# Patient Record
Sex: Male | Born: 1970 | Race: White | Hispanic: No | Marital: Married | Smoking: Never smoker
Health system: Southern US, Community
[De-identification: ages and names within clinical notes are randomized; demographics above are authoritative.]

## PROBLEM LIST (undated history)

## (undated) DIAGNOSIS — K219 Gastro-esophageal reflux disease without esophagitis: Secondary | ICD-10-CM

## (undated) DIAGNOSIS — I10 Essential (primary) hypertension: Secondary | ICD-10-CM

## (undated) DIAGNOSIS — H269 Unspecified cataract: Secondary | ICD-10-CM

## (undated) DIAGNOSIS — T7840XA Allergy, unspecified, initial encounter: Secondary | ICD-10-CM

## (undated) DIAGNOSIS — N19 Unspecified kidney failure: Secondary | ICD-10-CM

## (undated) DIAGNOSIS — E785 Hyperlipidemia, unspecified: Secondary | ICD-10-CM

## (undated) HISTORY — DX: Hyperlipidemia, unspecified: E78.5

## (undated) HISTORY — DX: Essential (primary) hypertension: I10

## (undated) HISTORY — DX: Gastro-esophageal reflux disease without esophagitis: K21.9

## (undated) HISTORY — PX: OTHER SURGICAL HISTORY: SHX169

## (undated) HISTORY — DX: Allergy, unspecified, initial encounter: T78.40XA

## (undated) HISTORY — DX: Unspecified kidney failure: N19

## (undated) HISTORY — DX: Unspecified cataract: H26.9

---

## 1995-08-24 HISTORY — PX: OTHER SURGICAL HISTORY: SHX169

## 2000-08-23 DIAGNOSIS — N19 Unspecified kidney failure: Secondary | ICD-10-CM

## 2000-08-23 HISTORY — DX: Unspecified kidney failure: N19

## 2000-08-23 HISTORY — PX: KIDNEY TRANSPLANT: SHX239

## 2000-12-30 ENCOUNTER — Encounter: Payer: Self-pay | Admitting: Vascular Surgery

## 2001-01-03 ENCOUNTER — Encounter: Payer: Self-pay | Admitting: Vascular Surgery

## 2001-01-03 ENCOUNTER — Ambulatory Visit (HOSPITAL_COMMUNITY): Admission: RE | Admit: 2001-01-03 | Discharge: 2001-01-03 | Payer: Self-pay | Admitting: Vascular Surgery

## 2001-07-31 ENCOUNTER — Ambulatory Visit (HOSPITAL_COMMUNITY): Admission: RE | Admit: 2001-07-31 | Discharge: 2001-07-31 | Payer: Self-pay | Admitting: Internal Medicine

## 2002-01-23 ENCOUNTER — Encounter: Payer: Self-pay | Admitting: Nephrology

## 2002-01-23 ENCOUNTER — Encounter: Admission: RE | Admit: 2002-01-23 | Discharge: 2002-01-23 | Payer: Self-pay | Admitting: Nephrology

## 2004-04-22 ENCOUNTER — Encounter: Admission: RE | Admit: 2004-04-22 | Discharge: 2004-04-22 | Payer: Self-pay | Admitting: Nephrology

## 2004-10-29 ENCOUNTER — Encounter: Admission: RE | Admit: 2004-10-29 | Discharge: 2004-10-29 | Payer: Self-pay | Admitting: Nephrology

## 2005-09-26 IMAGING — CR DG FOOT COMPLETE 3+V*R*
3 series · 3 of 3 positions shown · non-contrast
Comparison: none

CLINICAL DATA: Blunt trauma to foot two days ago.  Pain and swelling across midfoot.
 RIGHT FOOT COMPLETE 
 Three views show no acute radiographic abnormality.  There is some soft tissue swelling over the dorsum of the foot. 
 IMPRESSION
 Soft tissue swelling over the dorsum of the foot ? no fracture.

[view not recorded (1 of 3)]
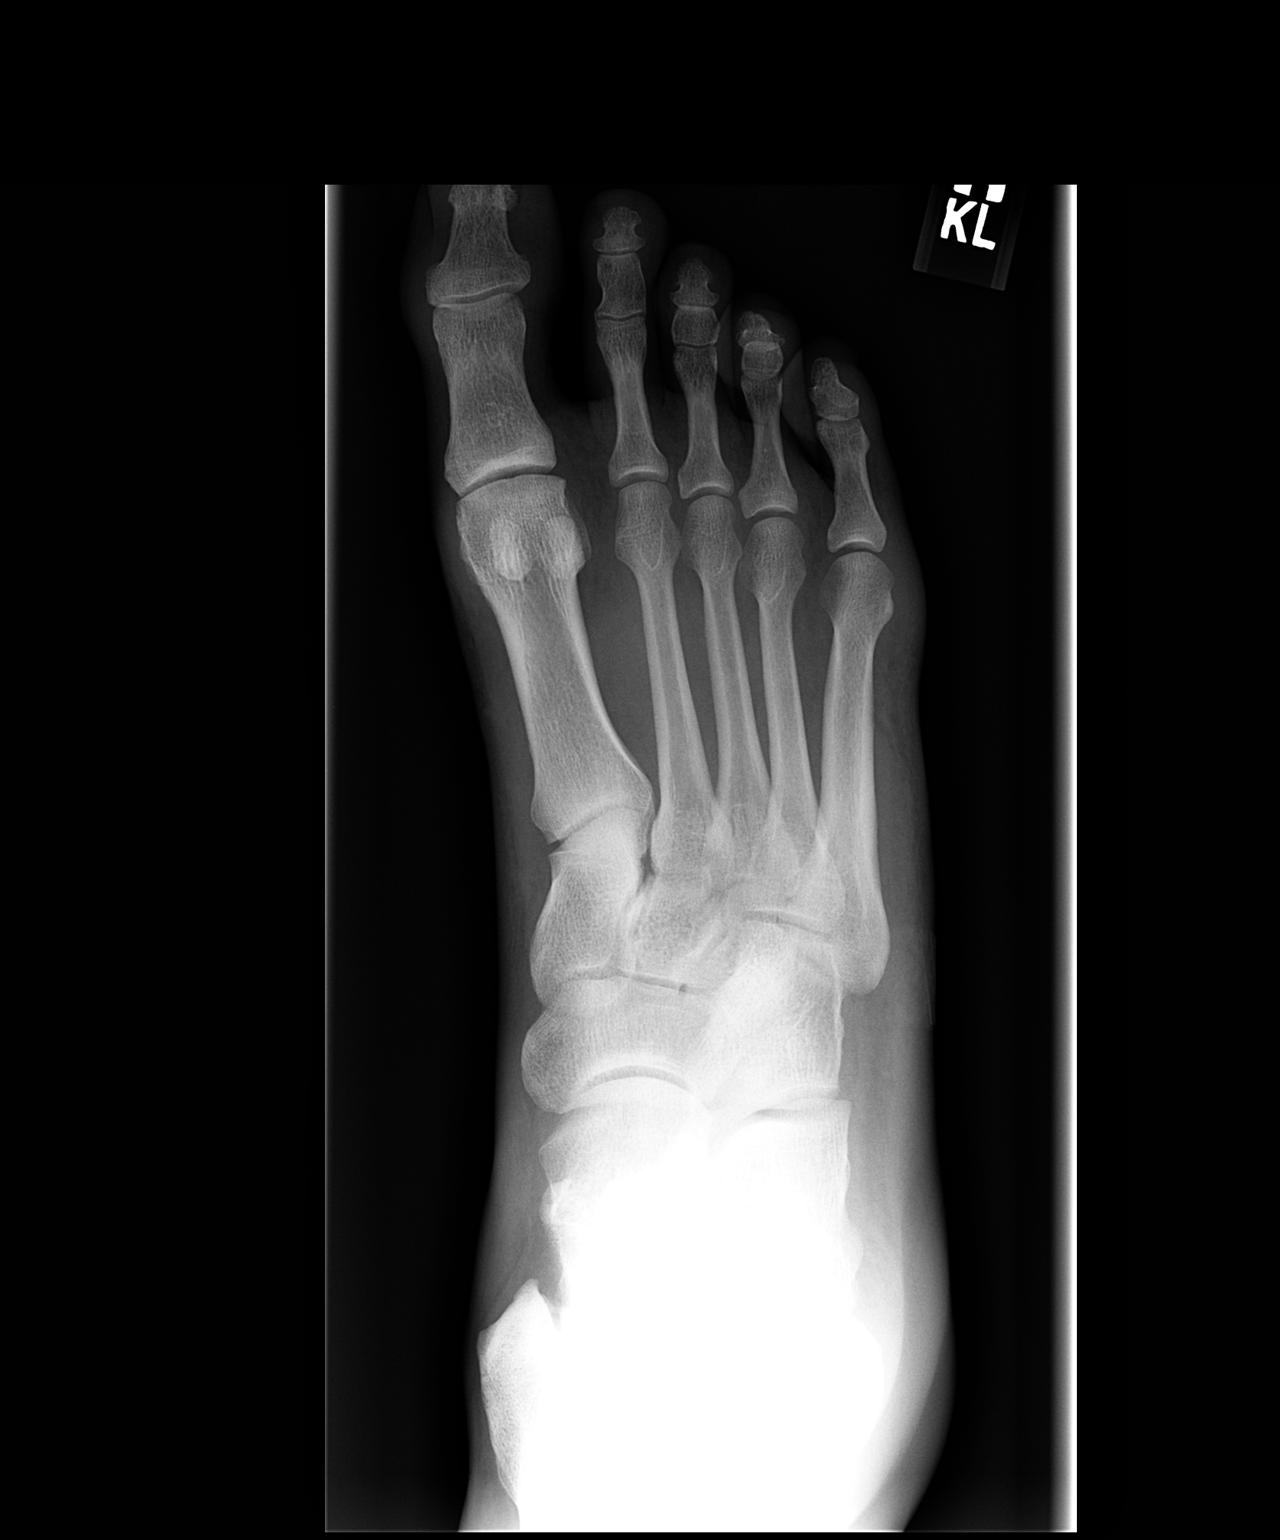

[view not recorded (2 of 3)]
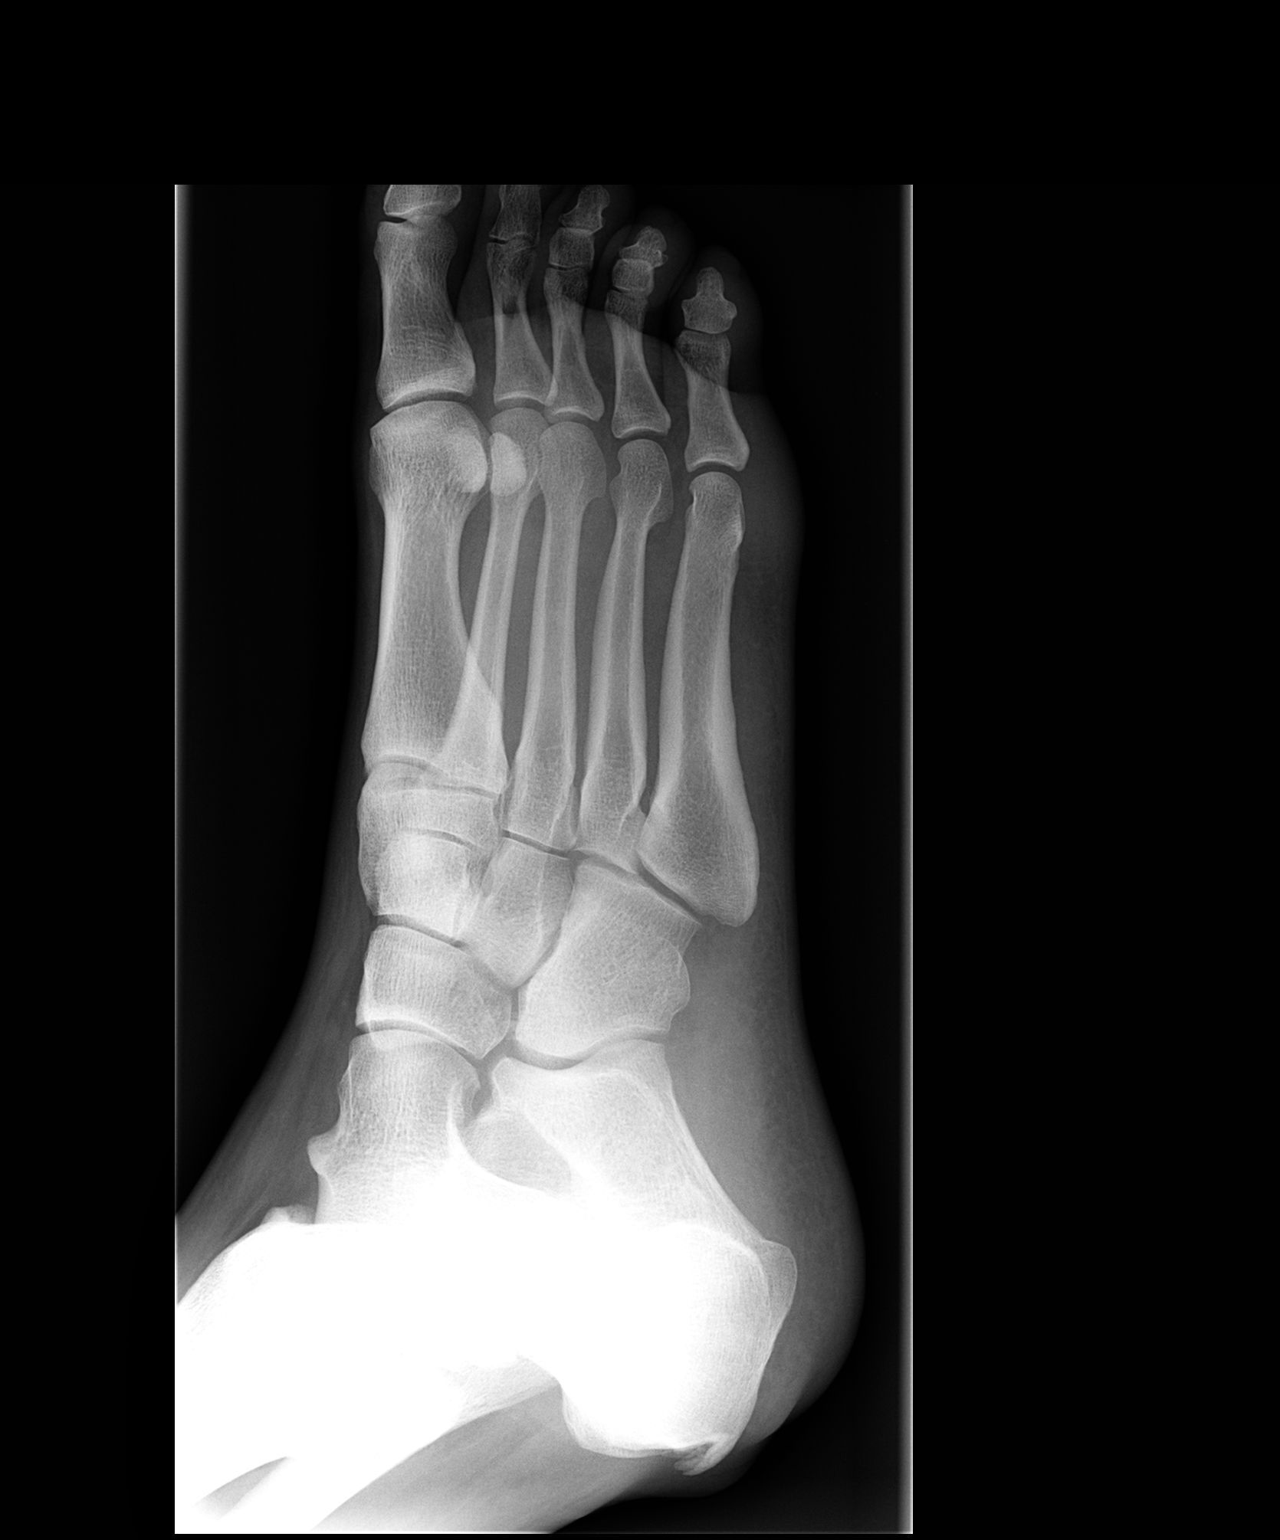

[view not recorded (3 of 3)]
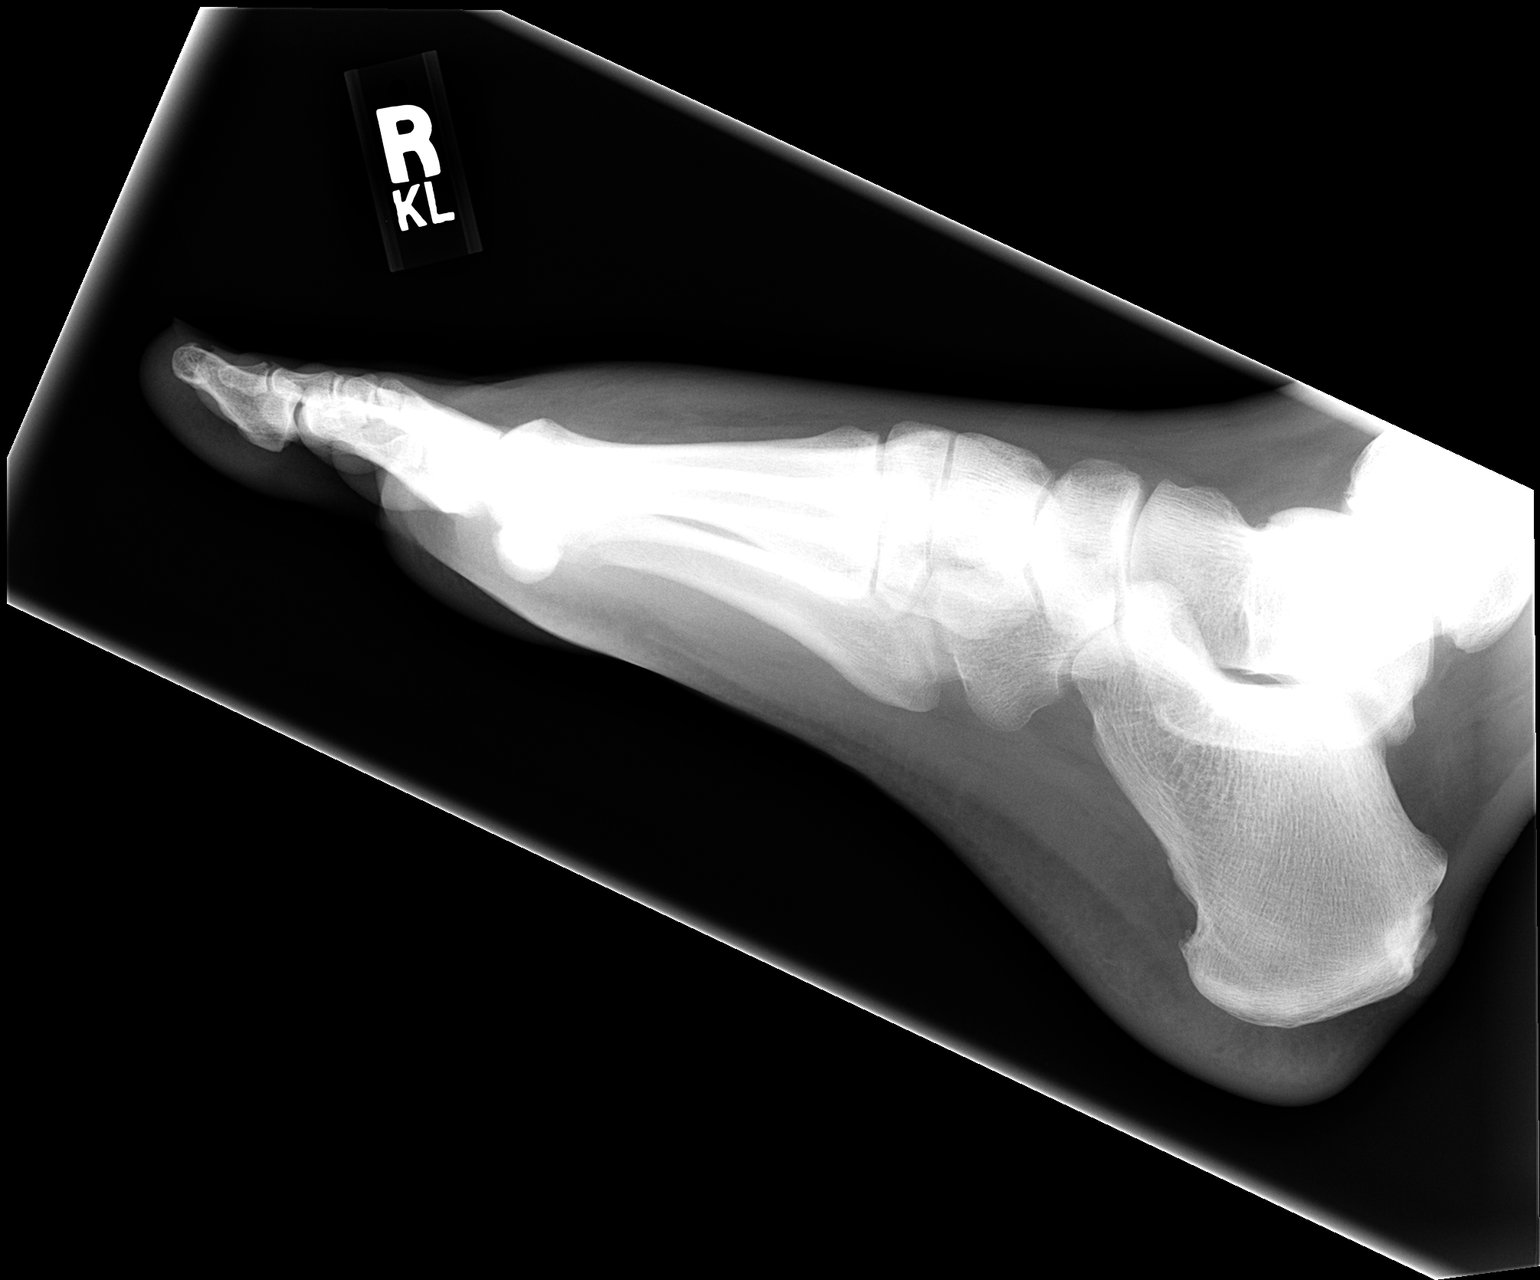

[3 of 3 positions shown; findings below may reference images not displayed]

## 2007-09-06 ENCOUNTER — Encounter: Admission: RE | Admit: 2007-09-06 | Discharge: 2007-09-06 | Payer: Self-pay | Admitting: Nephrology

## 2009-08-23 HISTORY — PX: TONSILLECTOMY: SUR1361

## 2009-09-26 ENCOUNTER — Ambulatory Visit (HOSPITAL_BASED_OUTPATIENT_CLINIC_OR_DEPARTMENT_OTHER): Admission: RE | Admit: 2009-09-26 | Discharge: 2009-09-26 | Payer: Self-pay | Admitting: Urology

## 2010-11-12 LAB — POCT I-STAT, CHEM 8
BUN: 15 mg/dL (ref 6–23)
Calcium, Ion: 1.25 mmol/L (ref 1.12–1.32)
Chloride: 105 mEq/L (ref 96–112)
Creatinine, Ser: 1.3 mg/dL (ref 0.4–1.5)
Glucose, Bld: 92 mg/dL (ref 70–99)
HCT: 47 % (ref 39.0–52.0)
Hemoglobin: 16 g/dL (ref 13.0–17.0)
Potassium: 3.9 mEq/L (ref 3.5–5.1)
Sodium: 142 mEq/L (ref 135–145)
TCO2: 30 mmol/L (ref 0–100)

## 2011-01-08 NOTE — Op Note (Signed)
[place]  Patient:    Michael Hayes, Michael Hayes                      MRN: [id number] Proc. Date: 01/03/01 Adm. Date:  [id number] Disc. Date: [id number] Attending:  Bennye Alm                           Operative Report  PREOPERATIVE DIAGNOSIS:  Chronic renal failure.  POSTOPERATIVE DIAGNOSIS:  Chronic renal failure.  PROCEDURE:  Placement of left internal jugular Ash catheter.  SURGEON:  Di Kindle. Edilia Bo, M.D.  ASSISTANT:  Nurse.  ANESTHESIA:  Local with sedation.  DESCRIPTION OF PROCEDURE:  The patient was taken to the operating room and sedated by anesthesia.  The neck and upper chest were prepped and draped in the usual sterile fashion.  After multiple attempts, I was unable to cannulate the right internal jugular vein, and therefore I decided to place a left IJ catheter.  After the skin was anesthetized, the left internal jugular vein was cannulated and a guidewire introduced into the superior vena cava without difficulty under fluoroscopic control.  The exit site for the catheter was chosen and the skin anesthetized between the two areas.  A 28 cm catheter was passed between the two incisions with the cuff positioned in the midportion of the tunnel.  The tract over the wire was then dilated, and then a dilator and peel-away sheath were passed over the wire and the wire and dilator removed. The catheter was passed through the peel-away sheath and positioned in the right atrium.  Both ports withdrew easily, were then flushed with heparinized saline and filled with concentrated heparin.  The catheter was secured at its exit site with a 3-0 nylon suture.  The IJ cannulation site was closed with a 4-0 subcuticular stitch.  A sterile dressing was applied, and the patient tolerated the procedure well and was transferred to the recovery room in satisfactory condition.  All needle and sponge counts were correct. DD:  01/03/01 TD:   01/03/01 Job: [id number]

## 2011-12-13 ENCOUNTER — Encounter: Payer: Self-pay | Admitting: Internal Medicine

## 2012-01-26 ENCOUNTER — Encounter: Payer: Self-pay | Admitting: Internal Medicine

## 2012-02-17 ENCOUNTER — Ambulatory Visit (AMBULATORY_SURGERY_CENTER): Payer: Managed Care, Other (non HMO) | Admitting: *Deleted

## 2012-02-17 ENCOUNTER — Encounter: Payer: Self-pay | Admitting: Internal Medicine

## 2012-02-17 VITALS — Ht 76.0 in | Wt 238.7 lb

## 2012-02-17 DIAGNOSIS — Z1211 Encounter for screening for malignant neoplasm of colon: Secondary | ICD-10-CM

## 2012-02-17 MED ORDER — MOVIPREP 100 G PO SOLR: ORAL | Status: DC

## 2012-02-17 NOTE — Progress Notes (Signed)
No allergies to eggs or soybeans 

## 2012-03-01 ENCOUNTER — Encounter: Payer: Self-pay | Admitting: *Deleted

## 2012-03-01 NOTE — Telephone Encounter (Signed)
Telephone note opened in error

## 2012-03-02 ENCOUNTER — Ambulatory Visit (AMBULATORY_SURGERY_CENTER): Payer: Managed Care, Other (non HMO) | Admitting: Internal Medicine

## 2012-03-02 ENCOUNTER — Encounter: Payer: Self-pay | Admitting: Internal Medicine

## 2012-03-02 VITALS — BP 115/59 | HR 57 | Temp 98.4°F | Resp 16 | Ht 76.0 in | Wt 238.0 lb

## 2012-03-02 DIAGNOSIS — Z800 Family history of malignant neoplasm of digestive organs: Secondary | ICD-10-CM

## 2012-03-02 DIAGNOSIS — Z1211 Encounter for screening for malignant neoplasm of colon: Secondary | ICD-10-CM

## 2012-03-02 MED ORDER — SODIUM CHLORIDE 0.9 % IV SOLN: 500.0000 mL | INTRAVENOUS | Status: DC

## 2012-03-02 NOTE — Patient Instructions (Addendum)
YOU HAD AN ENDOSCOPIC PROCEDURE TODAY AT THE [facility]: Refer to the procedure report that was given to you for any specific questions about what was found during the examination.  If the procedure report does not answer your questions, please call your gastroenterologist to clarify.  If you requested that your care partner not be given the details of your procedure findings, then the procedure report has been included in a sealed envelope for you to review at your convenience later.  YOU SHOULD EXPECT: Some feelings of bloating in the abdomen. Passage of more gas than usual.  Walking can help get rid of the air that was put into your GI tract during the procedure and reduce the bloating. If you had a lower endoscopy (such as a colonoscopy or flexible sigmoidoscopy) you may notice spotting of blood in your stool or on the toilet paper. If you underwent a bowel prep for your procedure, then you may not have a normal bowel movement for a few days.  DIET: Your first meal following the procedure should be a light meal and then it is ok to progress to your normal diet.  A half-sandwich or bowl of soup is an example of a good first meal.  Heavy or fried foods are harder to digest and may make you feel nauseous or bloated.  Likewise meals heavy in dairy and vegetables can cause extra gas to form and this can also increase the bloating.  Drink plenty of fluids but you should avoid alcoholic beverages for 24 hours.  ACTIVITY: Your care partner should take you home directly after the procedure.  You should plan to take it easy, moving slowly for the rest of the day.  You can resume normal activity the day after the procedure however you should NOT DRIVE or use heavy machinery for 24 hours (because of the sedation medicines used during the test).    SYMPTOMS TO REPORT IMMEDIATELY: A gastroenterologist can be reached at any hour.  During normal business hours, 8:30 AM to 5:00 PM Monday through Friday,  call [phone].  After hours and on weekends, please call the GI answering service at [phone] who will take a message and have the physician on call contact you.   Following lower endoscopy (colonoscopy or flexible sigmoidoscopy):  Excessive amounts of blood in the stool  Significant tenderness or worsening of abdominal pains  Swelling of the abdomen that is new, acute  Fever of 100F or higher      FOLLOW UP: If any biopsies were taken you will be contacted by phone or by letter within the next 1-3 weeks.  Call your gastroenterologist if you have not heard about the biopsies in 3 weeks.  Our staff will call the home number listed on your records the next business day following your procedure to check on you and address any questions or concerns that you may have at that time regarding the information given to you following your procedure. This is a courtesy call and so if there is no answer at the home number and we have not heard from you through the emergency physician on call, we will assume that you have returned to your regular daily activities without incident.  Normal colonscopy.  Follow up in 5 years (2018) due to family history.  SIGNATURES/CONFIDENTIALITY: You and/or your care partner have signed paperwork which will be entered into your electronic medical record.  These signatures attest to the fact that that the information above on  your After Visit Summary has been reviewed and is understood.  Full responsibility of the confidentiality of this discharge information lies with you and/or your care-partner.

## 2012-03-02 NOTE — Op Note (Signed)
[facility] [address] [place], [place]  [address]  COLONOSCOPY PROCEDURE REPORT  PATIENT:  Manav, Pierotti  MR#:  [id number] BIRTHDATE:  11/29/1970, 41 yrs. old  GENDER:  male ENDOSCOPIST:  Wilhemina Bonito. Eda Keys, MD REF. BY:  Screening / Recall PROCEDURE DATE:  03/02/2012 PROCEDURE:  Higher-risk screening colonoscopy G0105 ASA CLASS:  Class II INDICATIONS:  surveillance and high-risk screening, family history of colon cancer ; parent mid 25's (prior exam 2002 negative) MEDICATIONS:   MAC sedation, administered by CRNA, propofol (Diprivan) 350 mg IV  DESCRIPTION OF PROCEDURE:   After the risks benefits and alternatives of the procedure were thoroughly explained, informed consent was obtained.  Digital rectal exam was performed and revealed no abnormalities.   The LB CF-[id number] endoscope was introduced through the anus and advanced to the cecum, which was identified by both the appendix and ileocecal valve, without limitations.  The quality of the prep was excellent, using MoviPrep.  The instrument was then slowly withdrawn as the colon was fully examined. <<PROCEDUREIMAGES>>  FINDINGS:  A normal appearing cecum, ileocecal valve, and appendiceal orifice were identified. The ascending, hepatic flexure, transverse, splenic flexure, descending, sigmoid colon, and rectum appeared unremarkable.  No polyps or cancers were seen. Retroflexed views in the rectum revealed no abnormalities.    The time to cecum =  2:26  minutes. The scope was then withdrawn in 10:30  minutes from the cecum and the procedure completed.  COMPLICATIONS:  None  ENDOSCOPIC IMPRESSION: 1) Normal colon 2) No polyps or cancers  RECOMMENDATIONS: 1) Follow up colonoscopy in 5 years (family history)  ______________________________ Wilhemina Bonito. Eda Keys, MD  CC:  Kari Baars, MD;  The Patient  n. eSIGNED:   Wilhemina Bonito. Eda Keys at 03/02/2012 01:04 PM  Thomasene Lot, [phone]

## 2012-03-02 NOTE — Progress Notes (Signed)
Patient did not experience any of the following events: a burn prior to discharge; a fall within the facility; wrong site/side/patient/procedure/implant event; or a hospital transfer or hospital admission upon discharge from the facility. (G8907) Patient did not have preoperative order for IV antibiotic SSI prophylaxis. (G8918)  

## 2012-03-03 ENCOUNTER — Telehealth: Payer: Self-pay | Admitting: *Deleted

## 2012-03-03 NOTE — Telephone Encounter (Signed)
No answer, left message to call office if questions or concerns. 

## 2016-08-23 HISTORY — PX: COLONOSCOPY: SHX174

## 2016-09-14 DIAGNOSIS — E785 Hyperlipidemia, unspecified: Secondary | ICD-10-CM | POA: Diagnosis not present

## 2016-09-14 DIAGNOSIS — Z940 Kidney transplant status: Secondary | ICD-10-CM | POA: Diagnosis not present

## 2016-09-20 DIAGNOSIS — Z940 Kidney transplant status: Secondary | ICD-10-CM | POA: Diagnosis not present

## 2016-09-20 DIAGNOSIS — I129 Hypertensive chronic kidney disease with stage 1 through stage 4 chronic kidney disease, or unspecified chronic kidney disease: Secondary | ICD-10-CM | POA: Diagnosis not present

## 2016-09-20 DIAGNOSIS — E785 Hyperlipidemia, unspecified: Secondary | ICD-10-CM | POA: Diagnosis not present

## 2016-09-20 DIAGNOSIS — N183 Chronic kidney disease, stage 3 (moderate): Secondary | ICD-10-CM | POA: Diagnosis not present

## 2016-09-22 DIAGNOSIS — Z940 Kidney transplant status: Secondary | ICD-10-CM | POA: Diagnosis not present

## 2016-10-06 DIAGNOSIS — M5416 Radiculopathy, lumbar region: Secondary | ICD-10-CM | POA: Diagnosis not present

## 2016-10-06 DIAGNOSIS — Z6828 Body mass index (BMI) 28.0-28.9, adult: Secondary | ICD-10-CM | POA: Diagnosis not present

## 2016-10-25 ENCOUNTER — Encounter: Payer: Self-pay | Admitting: Physical Therapy

## 2016-10-25 ENCOUNTER — Ambulatory Visit: Payer: BLUE CROSS/BLUE SHIELD | Attending: Internal Medicine | Admitting: Physical Therapy

## 2016-10-25 DIAGNOSIS — M62830 Muscle spasm of back: Secondary | ICD-10-CM

## 2016-10-25 DIAGNOSIS — M6281 Muscle weakness (generalized): Secondary | ICD-10-CM | POA: Diagnosis not present

## 2016-10-25 DIAGNOSIS — G8929 Other chronic pain: Secondary | ICD-10-CM

## 2016-10-25 DIAGNOSIS — M5442 Lumbago with sciatica, left side: Secondary | ICD-10-CM | POA: Diagnosis not present

## 2016-10-26 ENCOUNTER — Encounter: Payer: Self-pay | Admitting: Physical Therapy

## 2016-10-26 NOTE — Therapy (Signed)
[place], [place], [address] Phone: [phone]   Fax:  [phone]  Physical Therapy Evaluation  Patient Details  Name: Michael Hayes MRN: [id number] Date of Birth: 18-Feb-1971 Referring Provider: Dr Marton Redwood   Encounter Date: 10/25/2016      PT End of Session - 10/26/16 1312    Visit Number 1   Number of Visits 12   Date for PT Re-Evaluation 12/07/16   Authorization Type Blue cross/ blue shield    PT Start Time 0800   PT Stop Time 0844   PT Time Calculation (min) 44 min   Activity Tolerance Patient tolerated treatment well   Behavior During Therapy [facility] for tasks assessed/performed      Past Medical History:  Diagnosis Date  . Hyperlipidemia   . Hypertension   . Kidney failure 2002   kidney transplant 2002    Past Surgical History:  Procedure Laterality Date  . bone spur  1997   heel, left foot  . KIDNEY TRANSPLANT  2002  . TONSILLECTOMY  2011    There were no vitals filed for this visit.       Subjective Assessment - 10/25/16 0803    Subjective Patient was lifting furniture when he began to have pain on the left side of his back. The pain goes down into his left buttock and left hamstring. He feels the pain when he is going form sit to stand. the pain is at its worst when he is sitting down into the car.    Pertinent History kidney transplant on the right side   Limitations Sitting;House hold activities   How long can you sit comfortably? No limit   How long can you stand comfortably? No limit   How long can you walk comfortably? No limit    Diagnostic tests No imaging tested    Currently in Pain? Yes   Pain Score 4   Pain only when standing up    Pain Location Back   Pain Orientation Left   Pain Descriptors / Indicators Aching   Pain Type Chronic pain   Pain Onset More than a month ago   Pain Frequency Intermittent   Aggravating Factors  transfering sit to stand              [facility] PT Assessment - 10/26/16 0001      Assessment   Medical Diagnosis Lef tisded lower back pain and Sciatica    Referring Provider Dr Marton Redwood    Onset Date/Surgical Date --  December 2018   Hand Dominance Right   Next MD Visit MD visit if needed    Prior Therapy None      Precautions   Precautions None     Restrictions   Weight Bearing Restrictions No     Balance Screen   Has the patient fallen in the past 6 months No   Has the patient had a decrease in activity level because of a fear of falling?  No   Is the patient reluctant to leave their home because of a fear of falling?  No     Home Environment   Living Environment Private residence   Additional Comments has steps but has no pain with steps      Prior Function   Level of Independence Independent   Vocation Full time employment   Corporate treasurer: Sits at a desk    Leisure Getting back to the gym  Cognition   Overall Cognitive Status Within Functional Limits for tasks assessed   Attention Focused   Focused Attention Appears intact   Memory Appears intact   Awareness Appears intact   Problem Solving Appears intact     Observation/Other Assessments   Observations Sits with flexed posture    Focus on Therapeutic Outcomes (FOTO)  33% limited      Sensation   Additional Comments Pain radiating into the left buttcck      Coordination   Gross Motor Movements are Fluid and Coordinated Yes   Fine Motor Movements are Fluid and Coordinated Yes     Posture/Postural Control   Posture/Postural Control No significant limitations     AROM   Overall AROM Comments left side glide reproduction of pain.    Lumbar Flexion limited 50% with pain    Lumbar Extension Linmited ho% with pain    Lumbar - Right Side Bend no limit    Lumbar - Left Side Bend painon the left side    Lumbar - Right Rotation No limi   Lumbar - Left Rotation pulling feeling on the right      Strength   Right  Hip Extension 5/5   Right Hip ABduction 5/5   Right Hip ADduction 5/5   Left Hip Flexion 4+/5   Left Hip ABduction 4+/5   Left Hip ADduction 4+/5   Right/Left Knee Right;Left   Right Knee Flexion 5/5   Right Knee Extension 5/5   Left Knee Flexion 5/5   Left Knee Extension 5/5     Palpation   SI assessment  No significant limitations in SI movement    Palpation comment minor spasming around L3 -L4 on the left; Normal pelvic allignment     Special Tests    Special Tests Lumbar   Lumbar Tests Straight Leg Raise     Straight Leg Raise   Comment (-) on the left      Transfers   Comments Pain when transfering sit to stand and stand to sit      Ambulation/Gait   Gait Comments No abnormalities                    [facility] Adult PT Treatment/Exercise - 10/26/16 0001      Lumbar Exercises: Stretches   Passive Hamstring Stretch Limitations 2x20sec 90/90 bilateral    Single Knee to Chest Stretch Limitations 2x20 seconds bilateral      Lumbar Exercises: Supine   Clam Limitations supine clam shells red 2x10    Heel Slides Limitations reviewed for progression    Bent Knee Raise Limitations 2x10      Lumbar Exercises: Prone   Other Prone Lumbar Exercises prone on elbow 30sec ; prone press press ups 2x10                 PT Education - 10/26/16 1311    Education provided Yes   Education Details reviewed HEP, symptom mangement, educated on lumbar disc dysfunction    Person(s) Educated Patient   Methods Explanation;Demonstration   Comprehension Verbalized understanding;Returned demonstration;Need further instruction          PT Short Term Goals - 10/26/16 1319      PT SHORT TERM GOAL #1   Title Patient will increase bilateral hamstring length by 25% bilateral    Time 4   Period Weeks   Status New     PT SHORT TERM GOAL #2   Title Patient will increase lumbar flexion  by 25%    Time 4   Period Weeks   Status New     PT SHORT TERM GOAL #3   Title  Patient will increase bilateral LE strength to 5/5    Time 4   Period Weeks   Status New     PT SHORT TERM GOAL #4   Title Patient will be independent with initial HEP    Time 4   Period Weeks   Status New           PT Long Term Goals - 10/26/16 1321      PT LONG TERM GOAL #1   Title Patient will return to gym program that promotes spinal stability and flexability    Time 6   Period Weeks   Status New     PT LONG TERM GOAL #2   Title Patient will transfer sit to stand and stand to sit without pain    Time 6   Period Weeks   Status New     PT LONG TERM GOAL #3   Title Patient willshow a 20% limitation on FOTO    Time 6   Period Weeks   Status New               Plan - 10/26/16 1313    Clinical Impression Statement Patient is a 47 year old male with left sided lower back pain that increase when he sits. Signs and symptoms are consistent with a lumbar disc buldge. He has increased pain with flexion and when moving to a sitting position. He has tight hamstrings L > R.  He would benefit from skilled therapy to improve low back pain and to improve his ability to sit pain free.    Rehab Potential Good   PT Frequency 2x / week   PT Duration 8 weeks   PT Treatment/Interventions ADLs/Self Care Home Management;Cryotherapy;Electrical Stimulation;Gait training;Ultrasound;Moist Heat;Traction;Therapeutic activities;Therapeutic exercise;Neuromuscular re-education;Patient/family education;Passive range of motion;Manual techniques;Dry needling;Energy conservation;Taping;Vasopneumatic Device   PT Next Visit Plan assess reaction to exercises, Consider adding bridging, sidelying hip abduction, straight leg raise, quadruped alternating UE/ LE; review lifting technique and squats, consider manual therapy if needed.    PT Home Exercise Plan hamstring stretch, single knee to chest, POE, prone press-ups    Recommended Other Services None    Consulted and Agree with Plan of Care Patient       Patient will benefit from skilled therapeutic intervention in order to improve the following deficits and impairments:  Decreased range of motion, Difficulty walking, Decreased endurance, Decreased strength, Decreased mobility, Pain, Increased muscle spasms  Visit Diagnosis: Chronic left-sided low back pain with left-sided sciatica - Plan: PT plan of care cert/re-cert  Muscle weakness (generalized) - Plan: PT plan of care cert/re-cert  Muscle spasm of back - Plan: PT plan of care cert/re-cert     Problem List There are no active problems to display for this patient.   Carney Living PT DPT  10/26/2016, 1:29 PM  [facility] [address] [place], [place], [address] Phone: [phone]   Fax:  [phone]  Name: Michael Hayes MRN: [id number] Date of Birth: 04/11/1971

## 2016-11-03 ENCOUNTER — Encounter: Payer: Self-pay | Admitting: Physical Therapy

## 2016-11-03 ENCOUNTER — Ambulatory Visit: Payer: BLUE CROSS/BLUE SHIELD | Admitting: Physical Therapy

## 2016-11-03 DIAGNOSIS — M6281 Muscle weakness (generalized): Secondary | ICD-10-CM | POA: Diagnosis not present

## 2016-11-03 DIAGNOSIS — M62830 Muscle spasm of back: Secondary | ICD-10-CM

## 2016-11-03 DIAGNOSIS — G8929 Other chronic pain: Secondary | ICD-10-CM

## 2016-11-03 DIAGNOSIS — M5442 Lumbago with sciatica, left side: Principal | ICD-10-CM

## 2016-11-03 NOTE — Therapy (Addendum)
[place], [place], [address] Phone: [phone]   Fax:  [phone]  Physical Therapy treatment/ discharge   Patient Details  Name: Michael Hayes MRN: [id number] Date of Birth: [birth year] Referring Provider: Dr Marton Redwood   Encounter Date: 11/03/2016      PT End of Session - 11/03/16 0806    Visit Number 2   Number of Visits 12   Date for PT Re-Evaluation 12/07/16   Authorization Type Blue cross/ blue shield    PT Start Time 0800   PT Stop Time 0844   PT Time Calculation (min) 44 min   Activity Tolerance Patient tolerated treatment well   Behavior During Therapy [facility] for tasks assessed/performed      Past Medical History:  Diagnosis Date  . Hyperlipidemia   . Hypertension   . Kidney failure 2002   kidney transplant 2002    Past Surgical History:  Procedure Laterality Date  . bone spur  1997   heel, left foot  . KIDNEY TRANSPLANT  2002  . TONSILLECTOMY  2011    There were no vitals filed for this visit.       Subjective Assessment - 11/03/16 0804    Subjective Patient reports his pain is more centralized and less intense. He has been doing his stretches and exercises.    Pertinent History kidney transplant on the right side   Limitations Sitting;House hold activities   How long can you sit comfortably? No limit   How long can you stand comfortably? No limit   How long can you walk comfortably? No limit    Diagnostic tests No imaging tested    Currently in Pain? Yes   Pain Score 2    Pain Location Back   Pain Orientation Left   Pain Descriptors / Indicators Aching   Pain Onset More than a month ago   Pain Frequency Intermittent   Aggravating Factors  transfering sit to stand    Pain Relieving Factors rest    Effect of Pain on Daily Activities pain when sitting                        [facility] Adult PT Treatment/Exercise - 11/03/16 0001      Lumbar Exercises:  Stretches   Passive Hamstring Stretch Limitations 2x20sec 90/90 bilateral    Single Knee to Chest Stretch Limitations 2x20 seconds bilateral    Piriformis Stretch Limitations 3x20sec hold      Lumbar Exercises: Supine   Clam Limitations supine clam shells red 2x10    Bridge Limitations 2x10    Straight Leg Raises Limitations 2x10     Lumbar Exercises: Prone   Other Prone Lumbar Exercises prone on elbow 30sec ; prone press press ups 2x10      Lumbar Exercises: Quadruped   Other Quadruped Lumbar Exercises prayer/ lateral prayer 3x20sec each    Other Quadruped Lumbar Exercises alt UE/ LE 2x5      Manual Therapy   Manual therapy comments IASTYM to lumbar spine L4-L5 PA glides.                 PT Education - 11/03/16 0805    Education provided Yes   Education Details updated HEP    Person(s) Educated Patient   Methods Explanation;Demonstration   Comprehension Verbalized understanding;Returned demonstration          PT Short Term Goals - 11/03/16 1307  PT SHORT TERM GOAL #1   Title Patient will increase bilateral hamstring length by 25% bilateral    Time 4   Period Weeks   Status On-going     PT SHORT TERM GOAL #2   Title Patient will increase lumbar flexion by 25%    Time 4   Period Weeks   Status On-going     PT SHORT TERM GOAL #3   Title Patient will increase bilateral LE strength to 5/5    Time 4   Period Weeks   Status On-going     PT SHORT TERM GOAL #4   Title Patient will be independent with initial HEP    Time 4   Period Weeks   Status On-going           PT Long Term Goals - 10/26/16 1321      PT LONG TERM GOAL #1   Title Patient will return to gym program that promotes spinal stability and flexability    Time 6   Period Weeks   Status New     PT LONG TERM GOAL #2   Title Patient will transfer sit to stand and stand to sit without pain    Time 6   Period Weeks   Status New     PT LONG TERM GOAL #3   Title Patient willshow a  20% limitation on FOTO    Time 6   Period Weeks   Status New               Plan - 11/03/16 0807    Clinical Impression Statement Patient is making good progress. He tolerated exercises well. No new goals met. Updated HEP and added increased intensity ther-ex. He feels like he is making ggood progress. He will try exercises at home and schedule if nreeded    Rehab Potential Good   PT Frequency 2x / week   PT Duration 8 weeks   PT Treatment/Interventions ADLs/Self Care Home Management;Cryotherapy;Electrical Stimulation;Gait training;Ultrasound;Moist Heat;Traction;Therapeutic activities;Therapeutic exercise;Neuromuscular re-education;Patient/family education;Passive range of motion;Manual techniques;Dry needling;Energy conservation;Taping;Vasopneumatic Device   PT Next Visit Plan assess reaction to exercises, Consider adding bridging, sidelying hip abduction, straight leg raise, quadruped alternating UE/ LE; review lifting technique and squats, consider manual therapy if needed.    PT Home Exercise Plan hamstring stretch, single knee to chest, POE, prone press-ups    Consulted and Agree with Plan of Care Patient      Patient will benefit from skilled therapeutic intervention in order to improve the following deficits and impairments:  Decreased range of motion, Difficulty walking, Decreased endurance, Decreased strength, Decreased mobility, Pain, Increased muscle spasms  Visit Diagnosis: Chronic left-sided low back pain with left-sided sciatica  Muscle weakness (generalized)  Muscle spasm of back   PHYSICAL THERAPY DISCHARGE SUMMARY  Visits from Start of Care: 2  Current functional level related to goals / functional outcomes: Improved symptoms. Patient feels he can continue on his on at home.    Remaining deficits: Intermittent pain    Education / Equipment: HEP Plan: Patient agrees to discharge.  Patient goals were not met. Patient is being discharged due to not  returning since the last visit.  ?????     Problem List There are no active problems to display for this patient.   Carney Living PT DPT  11/03/2016, 1:21 PM  [facility] [address] [place], [place], [address] Phone: [phone]   Fax:  [phone]  Name: Michael Wimberly  Hayes MRN: [id number] Date of Birth: [birth year]

## 2016-12-02 DIAGNOSIS — N183 Chronic kidney disease, stage 3 (moderate): Secondary | ICD-10-CM | POA: Diagnosis not present

## 2016-12-02 DIAGNOSIS — Z940 Kidney transplant status: Secondary | ICD-10-CM | POA: Diagnosis not present

## 2016-12-30 ENCOUNTER — Encounter: Payer: Self-pay | Admitting: Internal Medicine

## 2017-01-10 DIAGNOSIS — I10 Essential (primary) hypertension: Secondary | ICD-10-CM | POA: Diagnosis not present

## 2017-01-10 DIAGNOSIS — Z0000 Encounter for general adult medical examination without abnormal findings: Secondary | ICD-10-CM | POA: Diagnosis not present

## 2017-01-10 DIAGNOSIS — Z125 Encounter for screening for malignant neoplasm of prostate: Secondary | ICD-10-CM | POA: Diagnosis not present

## 2017-01-18 DIAGNOSIS — D849 Immunodeficiency, unspecified: Secondary | ICD-10-CM | POA: Diagnosis not present

## 2017-01-18 DIAGNOSIS — I10 Essential (primary) hypertension: Secondary | ICD-10-CM | POA: Diagnosis not present

## 2017-01-18 DIAGNOSIS — Z1389 Encounter for screening for other disorder: Secondary | ICD-10-CM | POA: Diagnosis not present

## 2017-01-18 DIAGNOSIS — E784 Other hyperlipidemia: Secondary | ICD-10-CM | POA: Diagnosis not present

## 2017-01-18 DIAGNOSIS — Z0000 Encounter for general adult medical examination without abnormal findings: Secondary | ICD-10-CM | POA: Diagnosis not present

## 2017-01-18 DIAGNOSIS — Z940 Kidney transplant status: Secondary | ICD-10-CM | POA: Diagnosis not present

## 2017-01-18 DIAGNOSIS — Z125 Encounter for screening for malignant neoplasm of prostate: Secondary | ICD-10-CM | POA: Diagnosis not present

## 2017-03-18 DIAGNOSIS — I129 Hypertensive chronic kidney disease with stage 1 through stage 4 chronic kidney disease, or unspecified chronic kidney disease: Secondary | ICD-10-CM | POA: Diagnosis not present

## 2017-03-18 DIAGNOSIS — E785 Hyperlipidemia, unspecified: Secondary | ICD-10-CM | POA: Diagnosis not present

## 2017-03-18 DIAGNOSIS — N183 Chronic kidney disease, stage 3 (moderate): Secondary | ICD-10-CM | POA: Diagnosis not present

## 2017-03-18 DIAGNOSIS — Z940 Kidney transplant status: Secondary | ICD-10-CM | POA: Diagnosis not present

## 2017-03-21 DIAGNOSIS — Z940 Kidney transplant status: Secondary | ICD-10-CM | POA: Diagnosis not present

## 2017-04-29 ENCOUNTER — Encounter: Payer: Self-pay | Admitting: Internal Medicine

## 2017-05-16 ENCOUNTER — Other Ambulatory Visit: Payer: Self-pay | Admitting: Internal Medicine

## 2017-05-16 DIAGNOSIS — R221 Localized swelling, mass and lump, neck: Secondary | ICD-10-CM | POA: Diagnosis not present

## 2017-05-19 ENCOUNTER — Ambulatory Visit
Admission: RE | Admit: 2017-05-19 | Discharge: 2017-05-19 | Disposition: A | Discharge status: Home / Self Care | Payer: BLUE CROSS/BLUE SHIELD | Source: Ambulatory Visit | Attending: Internal Medicine | Admitting: Internal Medicine

## 2017-05-19 DIAGNOSIS — R221 Localized swelling, mass and lump, neck: Secondary | ICD-10-CM | POA: Diagnosis not present

## 2017-06-21 DIAGNOSIS — Z940 Kidney transplant status: Secondary | ICD-10-CM | POA: Diagnosis not present

## 2017-06-21 DIAGNOSIS — E785 Hyperlipidemia, unspecified: Secondary | ICD-10-CM | POA: Diagnosis not present

## 2017-07-01 ENCOUNTER — Ambulatory Visit (AMBULATORY_SURGERY_CENTER): Payer: Self-pay

## 2017-07-01 ENCOUNTER — Other Ambulatory Visit: Payer: Self-pay

## 2017-07-01 VITALS — Ht 76.0 in | Wt 248.0 lb

## 2017-07-01 DIAGNOSIS — Z800 Family history of malignant neoplasm of digestive organs: Secondary | ICD-10-CM

## 2017-07-01 MED ORDER — NA SULFATE-K SULFATE-MG SULF 17.5-3.13-1.6 GM/177ML PO SOLN: 1.0000 | Freq: Once | ORAL | 0 refills | Status: AC

## 2017-07-01 NOTE — Progress Notes (Signed)
Denies allergies to eggs or soy products. Denies complication of anesthesia or sedation. Denies use of weight loss medication. Denies use of O2.   Emmi instructions declined.  

## 2017-07-08 ENCOUNTER — Encounter: Payer: Self-pay | Admitting: Internal Medicine

## 2017-07-12 ENCOUNTER — Ambulatory Visit (AMBULATORY_SURGERY_CENTER): Payer: BLUE CROSS/BLUE SHIELD | Admitting: Internal Medicine

## 2017-07-12 ENCOUNTER — Encounter: Payer: Self-pay | Admitting: Internal Medicine

## 2017-07-12 VITALS — BP 117/78 | HR 65 | Temp 98.6°F | Resp 16 | Ht 76.0 in | Wt 248.0 lb

## 2017-07-12 DIAGNOSIS — Z1212 Encounter for screening for malignant neoplasm of rectum: Secondary | ICD-10-CM

## 2017-07-12 DIAGNOSIS — Z800 Family history of malignant neoplasm of digestive organs: Secondary | ICD-10-CM | POA: Diagnosis present

## 2017-07-12 DIAGNOSIS — Z1211 Encounter for screening for malignant neoplasm of colon: Secondary | ICD-10-CM | POA: Diagnosis not present

## 2017-07-12 MED ORDER — SODIUM CHLORIDE 0.9 % IV SOLN: 500.0000 mL | INTRAVENOUS | Status: DC

## 2017-07-12 NOTE — Patient Instructions (Signed)
Impression/recommendations:  Hemorrhoids (handout given)  YOU HAD AN ENDOSCOPIC PROCEDURE TODAY AT [place]:   Refer to the procedure report that was given to you for any specific questions about what was found during the examination.  If the procedure report does not answer your questions, please call your gastroenterologist to clarify.  If you requested that your care partner not be given the details of your procedure findings, then the procedure report has been included in a sealed envelope for you to review at your convenience later.  YOU SHOULD EXPECT: Some feelings of bloating in the abdomen. Passage of more gas than usual.  Walking can help get rid of the air that was put into your GI tract during the procedure and reduce the bloating. If you had a lower endoscopy (such as a colonoscopy or flexible sigmoidoscopy) you may notice spotting of blood in your stool or on the toilet paper. If you underwent a bowel prep for your procedure, you may not have a normal bowel movement for a few days.  Please Note:  You might notice some irritation and congestion in your nose or some drainage.  This is from the oxygen used during your procedure.  There is no need for concern and it should clear up in a day or so.  SYMPTOMS TO REPORT IMMEDIATELY:   Following lower endoscopy (colonoscopy or flexible sigmoidoscopy):  Excessive amounts of blood in the stool  Significant tenderness or worsening of abdominal pains  Swelling of the abdomen that is new, acute  Fever of 100F or higher  For urgent or emergent issues, a gastroenterologist can be reached at any hour by calling [phone].   DIET:  We do recommend a small meal at first, but then you may proceed to your regular diet.  Drink plenty of fluids but you should avoid alcoholic beverages for 24 hours.  ACTIVITY:  You should plan to take it easy for the rest of today and you should NOT DRIVE or use heavy machinery until tomorrow  (because of the sedation medicines used during the test).    FOLLOW UP: Our staff will call the number listed on your records the next business day following your procedure to check on you and address any questions or concerns that you may have regarding the information given to you following your procedure. If we do not reach you, we will leave a message.  However, if you are feeling well and you are not experiencing any problems, there is no need to return our call.  We will assume that you have returned to your regular daily activities without incident.  If any biopsies were taken you will be contacted by phone or by letter within the next 1-3 weeks.  Please call us at [phone] if you have not heard about the biopsies in 3 weeks.    SIGNATURES/CONFIDENTIALITY: You and/or your care partner have signed paperwork which will be entered into your electronic medical record.  These signatures attest to the fact that that the information above on your After Visit Summary has been reviewed and is understood.  Full responsibility of the confidentiality of this discharge information lies with you and/or your care-partner.

## 2017-07-12 NOTE — Op Note (Signed)
[place] Patient Name: Michael Hayes Procedure Date: 07/12/2017 10:37 AM MRN: [id number] Endoscopist: Docia Chuck. Henrene Pastor , MD Age: 46 Referring MD:  Date of Birth: 03/05/1971 Gender: Male Account #: [id number] Procedure:                Colonoscopy Indications:              Screening in patient at increased risk: Colorectal                            cancer in father before age 73. Prior negative                            examinations 2002 and 2013 Medicines:                Monitored Anesthesia Care Procedure:                Pre-Anesthesia Assessment:                           - Prior to the procedure, a History and Physical                            was performed, and patient medications and                            allergies were reviewed. The patient's tolerance of                            previous anesthesia was also reviewed. The risks                            and benefits of the procedure and the sedation                            options and risks were discussed with the patient.                            All questions were answered, and informed consent                            was obtained. Prior Anticoagulants: The patient has                            taken no previous anticoagulant or antiplatelet                            agents. ASA Grade Assessment: II - A patient with                            mild systemic disease. After reviewing the risks                            and benefits, the patient was deemed in  satisfactory condition to undergo the procedure.                           After obtaining informed consent, the colonoscope                            was passed under direct vision. Throughout the                            procedure, the patient's blood pressure, pulse, and                            oxygen saturations were monitored continuously. The                            Colonoscope was introduced through  the anus and                            advanced to the the cecum, identified by                            appendiceal orifice and ileocecal valve. The                            ileocecal valve, appendiceal orifice, and rectum                            were photographed. The quality of the bowel                            preparation was excellent. The colonoscopy was                            performed without difficulty. The patient tolerated                            the procedure well. The bowel preparation used was                            SUPREP. Scope In: 10:48:58 AM Scope Out: 11:00:44 AM Scope Withdrawal Time: 0 hours 10 minutes 7 seconds  Total Procedure Duration: 0 hours 11 minutes 46 seconds  Findings:                 Internal hemorrhoids were found during retroflexion.                           The exam was otherwise without abnormality on                            direct and retroflexion views. Complications:            No immediate complications. Estimated blood loss:                            None. Estimated Blood  Loss:     Estimated blood loss: none. Impression:               - Internal hemorrhoids.                           - The examination was otherwise normal on direct                            and retroflexion views.                           - No specimens collected. Recommendation:           - Repeat colonoscopy in 5 years for screening                            purposes (family history).                           - Patient has a contact number available for                            emergencies. The signs and symptoms of potential                            delayed complications were discussed with the                            patient. Return to normal activities tomorrow.                            Written discharge instructions were provided to the                            patient.                           - Resume previous diet.                            - Continue present medications. Docia Chuck. Henrene Pastor, MD 07/12/2017 11:08:08 AM This report has been signed electronically.

## 2017-07-12 NOTE — Progress Notes (Signed)
To PACU, VSS. Report to [email or web address] 

## 2017-07-13 ENCOUNTER — Telehealth: Payer: Self-pay | Admitting: *Deleted

## 2017-07-13 ENCOUNTER — Telehealth: Payer: Self-pay

## 2017-07-13 NOTE — Telephone Encounter (Signed)
Left message on answering machine. 

## 2017-07-13 NOTE — Telephone Encounter (Signed)
  Follow up Call-  Call back number 07/12/2017  Post procedure Call Back phone  # [phone]  Permission to leave phone message Yes  Some recent data might be hidden    [facility]

## 2017-09-07 DIAGNOSIS — Z940 Kidney transplant status: Secondary | ICD-10-CM | POA: Diagnosis not present

## 2017-09-13 DIAGNOSIS — E785 Hyperlipidemia, unspecified: Secondary | ICD-10-CM | POA: Diagnosis not present

## 2017-09-13 DIAGNOSIS — N183 Chronic kidney disease, stage 3 (moderate): Secondary | ICD-10-CM | POA: Diagnosis not present

## 2017-09-13 DIAGNOSIS — I129 Hypertensive chronic kidney disease with stage 1 through stage 4 chronic kidney disease, or unspecified chronic kidney disease: Secondary | ICD-10-CM | POA: Diagnosis not present

## 2017-09-13 DIAGNOSIS — Z940 Kidney transplant status: Secondary | ICD-10-CM | POA: Diagnosis not present

## 2017-12-07 DIAGNOSIS — E785 Hyperlipidemia, unspecified: Secondary | ICD-10-CM | POA: Diagnosis not present

## 2017-12-07 DIAGNOSIS — I129 Hypertensive chronic kidney disease with stage 1 through stage 4 chronic kidney disease, or unspecified chronic kidney disease: Secondary | ICD-10-CM | POA: Diagnosis not present

## 2017-12-07 DIAGNOSIS — Z940 Kidney transplant status: Secondary | ICD-10-CM | POA: Diagnosis not present

## 2018-01-17 DIAGNOSIS — Z940 Kidney transplant status: Secondary | ICD-10-CM | POA: Diagnosis not present

## 2018-01-17 DIAGNOSIS — Z125 Encounter for screening for malignant neoplasm of prostate: Secondary | ICD-10-CM | POA: Diagnosis not present

## 2018-01-17 DIAGNOSIS — Z0000 Encounter for general adult medical examination without abnormal findings: Secondary | ICD-10-CM | POA: Diagnosis not present

## 2018-01-17 DIAGNOSIS — R82998 Other abnormal findings in urine: Secondary | ICD-10-CM | POA: Diagnosis not present

## 2018-01-17 DIAGNOSIS — I10 Essential (primary) hypertension: Secondary | ICD-10-CM | POA: Diagnosis not present

## 2018-01-17 DIAGNOSIS — E7849 Other hyperlipidemia: Secondary | ICD-10-CM | POA: Diagnosis not present

## 2018-01-24 DIAGNOSIS — I10 Essential (primary) hypertension: Secondary | ICD-10-CM | POA: Diagnosis not present

## 2018-01-24 DIAGNOSIS — Z940 Kidney transplant status: Secondary | ICD-10-CM | POA: Diagnosis not present

## 2018-01-24 DIAGNOSIS — Z0000 Encounter for general adult medical examination without abnormal findings: Secondary | ICD-10-CM | POA: Diagnosis not present

## 2018-01-24 DIAGNOSIS — Z1389 Encounter for screening for other disorder: Secondary | ICD-10-CM | POA: Diagnosis not present

## 2018-01-24 DIAGNOSIS — E7849 Other hyperlipidemia: Secondary | ICD-10-CM | POA: Diagnosis not present

## 2018-01-24 DIAGNOSIS — D849 Immunodeficiency, unspecified: Secondary | ICD-10-CM | POA: Diagnosis not present

## 2018-02-02 DIAGNOSIS — Z1212 Encounter for screening for malignant neoplasm of rectum: Secondary | ICD-10-CM | POA: Diagnosis not present

## 2018-03-06 DIAGNOSIS — Z940 Kidney transplant status: Secondary | ICD-10-CM | POA: Diagnosis not present

## 2018-03-06 DIAGNOSIS — I129 Hypertensive chronic kidney disease with stage 1 through stage 4 chronic kidney disease, or unspecified chronic kidney disease: Secondary | ICD-10-CM | POA: Diagnosis not present

## 2018-03-08 DIAGNOSIS — I129 Hypertensive chronic kidney disease with stage 1 through stage 4 chronic kidney disease, or unspecified chronic kidney disease: Secondary | ICD-10-CM | POA: Diagnosis not present

## 2018-03-08 DIAGNOSIS — N183 Chronic kidney disease, stage 3 (moderate): Secondary | ICD-10-CM | POA: Diagnosis not present

## 2018-03-08 DIAGNOSIS — Z940 Kidney transplant status: Secondary | ICD-10-CM | POA: Diagnosis not present

## 2018-03-08 DIAGNOSIS — E785 Hyperlipidemia, unspecified: Secondary | ICD-10-CM | POA: Diagnosis not present

## 2018-03-22 DIAGNOSIS — N183 Chronic kidney disease, stage 3 (moderate): Secondary | ICD-10-CM | POA: Diagnosis not present

## 2018-06-05 DIAGNOSIS — N183 Chronic kidney disease, stage 3 (moderate): Secondary | ICD-10-CM | POA: Diagnosis not present

## 2018-06-05 DIAGNOSIS — Z940 Kidney transplant status: Secondary | ICD-10-CM | POA: Diagnosis not present

## 2018-06-05 DIAGNOSIS — E785 Hyperlipidemia, unspecified: Secondary | ICD-10-CM | POA: Diagnosis not present

## 2018-09-05 DIAGNOSIS — I129 Hypertensive chronic kidney disease with stage 1 through stage 4 chronic kidney disease, or unspecified chronic kidney disease: Secondary | ICD-10-CM | POA: Diagnosis not present

## 2018-09-05 DIAGNOSIS — Z940 Kidney transplant status: Secondary | ICD-10-CM | POA: Diagnosis not present

## 2018-09-12 DIAGNOSIS — E785 Hyperlipidemia, unspecified: Secondary | ICD-10-CM | POA: Diagnosis not present

## 2018-09-12 DIAGNOSIS — I129 Hypertensive chronic kidney disease with stage 1 through stage 4 chronic kidney disease, or unspecified chronic kidney disease: Secondary | ICD-10-CM | POA: Diagnosis not present

## 2018-09-12 DIAGNOSIS — Z940 Kidney transplant status: Secondary | ICD-10-CM | POA: Diagnosis not present

## 2018-09-12 DIAGNOSIS — N183 Chronic kidney disease, stage 3 (moderate): Secondary | ICD-10-CM | POA: Diagnosis not present

## 2018-12-05 IMAGING — US US SOFT TISSUE HEAD/NECK
1 series · 11 of 11 positions shown · non-contrast
Comparison: None.

CLINICAL DATA: Palpable left submandibular region x2 months,
nontender

EXAM:
ULTRASOUND OF HEAD/NECK SOFT TISSUES
TECHNIQUE: Ultrasound examination of the head and neck soft tissues was
performed in the area of clinical concern.

[Series 1: us soft tissue head/neck · 0.04mm/px · 11 of 11 slices shown]
[im 1/11]
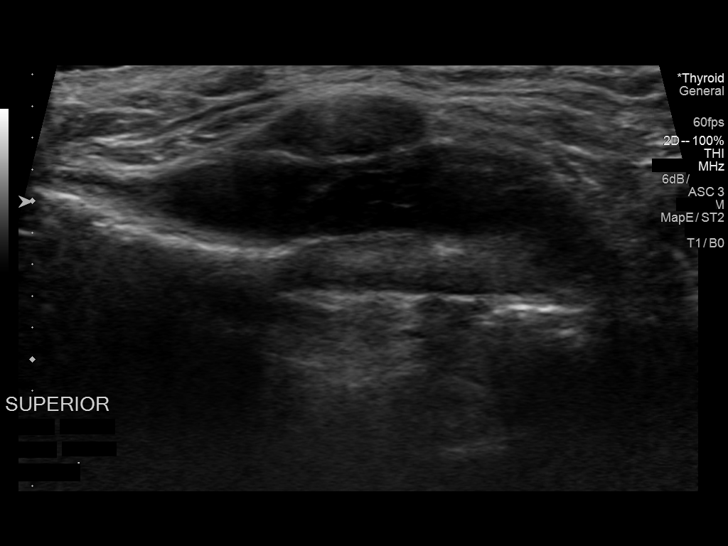
[im 2/11]
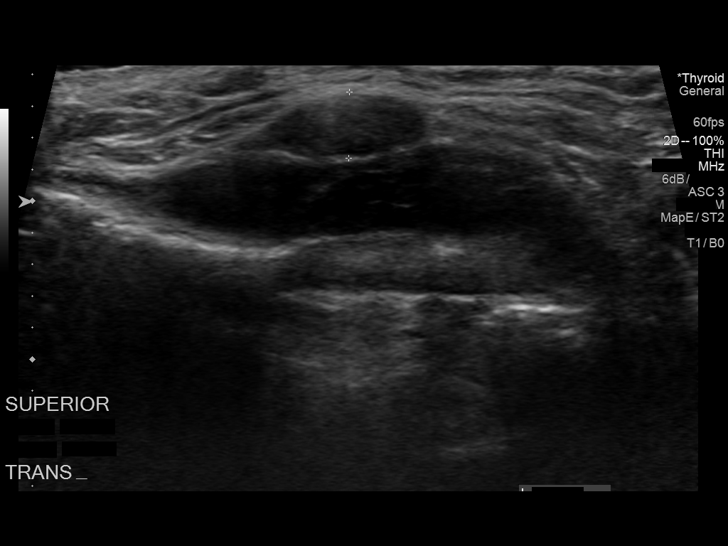
[im 3/11]
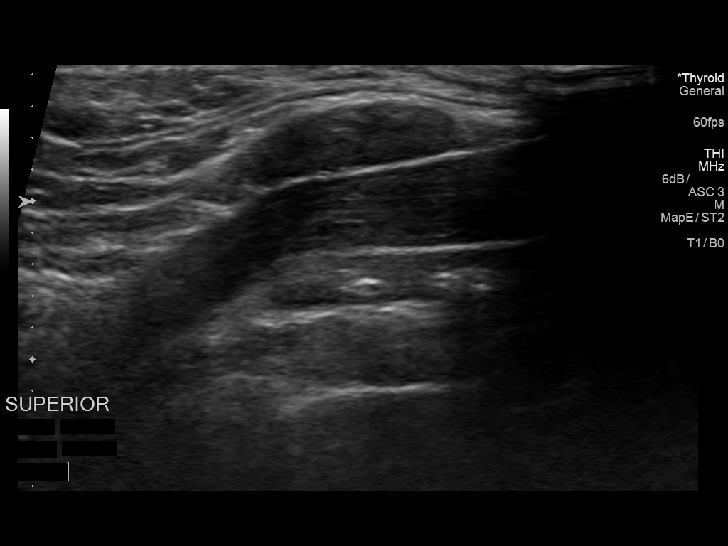
[im 4/11]
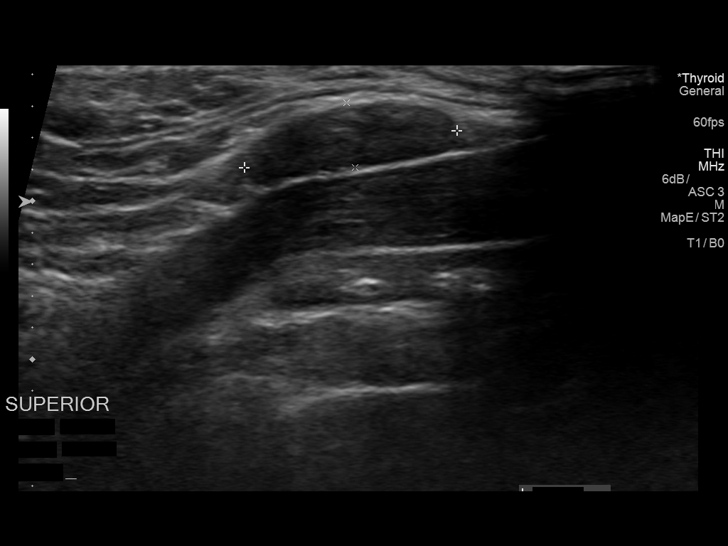
[im 5/11]
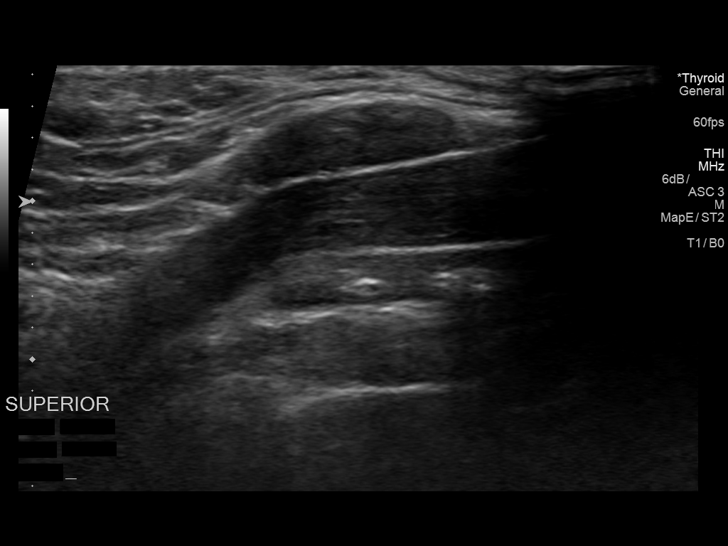
[im 6/11]
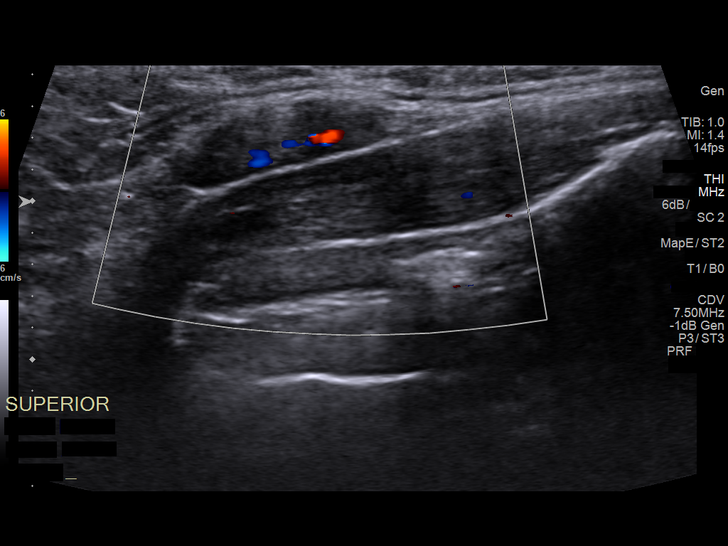
[im 7/11]
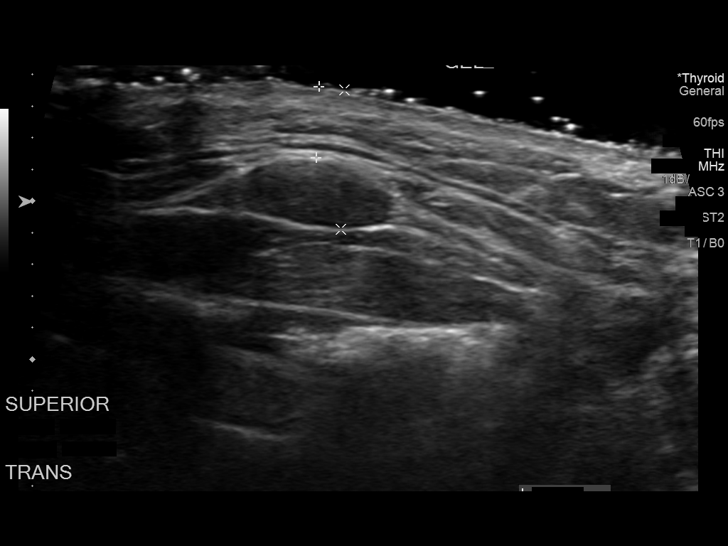
[im 8/11]
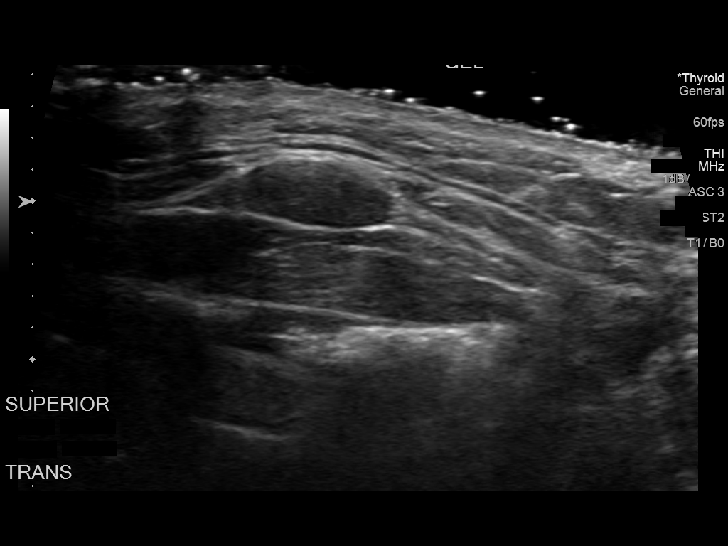
[im 9/11]
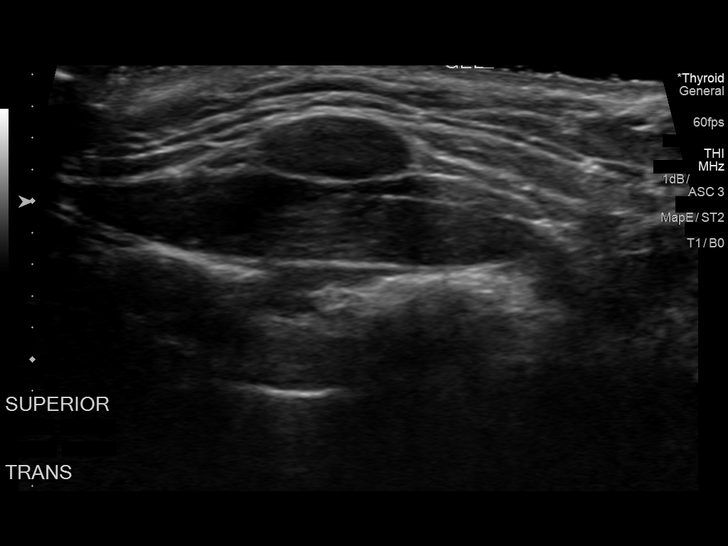
[im 10/11]
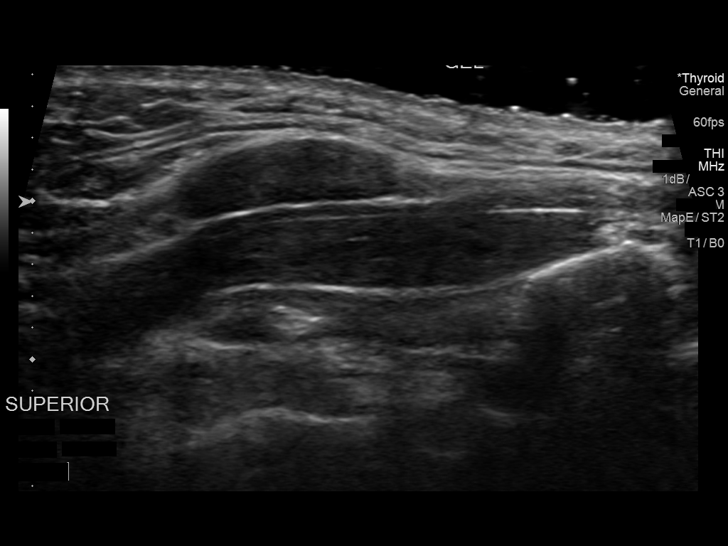
[im 11/11]
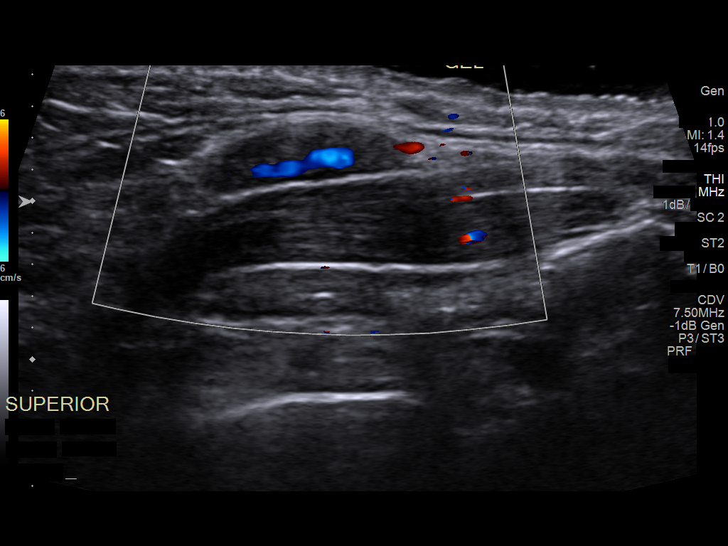

[11 of 11 positions shown; findings below may reference images not displayed]

FINDINGS: Morphologically unremarkable subcutaneous lymph node measuring
cm short axis diameter corresponds to the palpable region. Low-level
color flow signal noted in the hilum of the node. No mass, cyst,
abscess, aneurysm, adenopathy, or other pathologic findings.
IMPRESSION: 1. Normal-sized cervical lymph node corresponding to palpable
region.

## 2019-01-22 DIAGNOSIS — Z125 Encounter for screening for malignant neoplasm of prostate: Secondary | ICD-10-CM | POA: Diagnosis not present

## 2019-01-22 DIAGNOSIS — E7849 Other hyperlipidemia: Secondary | ICD-10-CM | POA: Diagnosis not present

## 2019-01-22 DIAGNOSIS — Z940 Kidney transplant status: Secondary | ICD-10-CM | POA: Diagnosis not present

## 2019-01-22 DIAGNOSIS — I10 Essential (primary) hypertension: Secondary | ICD-10-CM | POA: Diagnosis not present

## 2019-01-29 DIAGNOSIS — Z1331 Encounter for screening for depression: Secondary | ICD-10-CM | POA: Diagnosis not present

## 2019-01-29 DIAGNOSIS — Z0000 Encounter for general adult medical examination without abnormal findings: Secondary | ICD-10-CM | POA: Diagnosis not present

## 2019-01-29 DIAGNOSIS — E785 Hyperlipidemia, unspecified: Secondary | ICD-10-CM | POA: Diagnosis not present

## 2019-01-29 DIAGNOSIS — Z940 Kidney transplant status: Secondary | ICD-10-CM | POA: Diagnosis not present

## 2019-01-29 DIAGNOSIS — I10 Essential (primary) hypertension: Secondary | ICD-10-CM | POA: Diagnosis not present

## 2019-01-29 DIAGNOSIS — Z800 Family history of malignant neoplasm of digestive organs: Secondary | ICD-10-CM | POA: Diagnosis not present

## 2019-01-30 DIAGNOSIS — I10 Essential (primary) hypertension: Secondary | ICD-10-CM | POA: Diagnosis not present

## 2019-01-30 DIAGNOSIS — R82998 Other abnormal findings in urine: Secondary | ICD-10-CM | POA: Diagnosis not present

## 2019-03-07 DIAGNOSIS — Z940 Kidney transplant status: Secondary | ICD-10-CM | POA: Diagnosis not present

## 2019-03-07 DIAGNOSIS — I129 Hypertensive chronic kidney disease with stage 1 through stage 4 chronic kidney disease, or unspecified chronic kidney disease: Secondary | ICD-10-CM | POA: Diagnosis not present

## 2019-03-15 DIAGNOSIS — E785 Hyperlipidemia, unspecified: Secondary | ICD-10-CM | POA: Diagnosis not present

## 2019-03-15 DIAGNOSIS — I129 Hypertensive chronic kidney disease with stage 1 through stage 4 chronic kidney disease, or unspecified chronic kidney disease: Secondary | ICD-10-CM | POA: Diagnosis not present

## 2019-03-15 DIAGNOSIS — N183 Chronic kidney disease, stage 3 (moderate): Secondary | ICD-10-CM | POA: Diagnosis not present

## 2019-03-15 DIAGNOSIS — Z940 Kidney transplant status: Secondary | ICD-10-CM | POA: Diagnosis not present

## 2019-06-20 DIAGNOSIS — I129 Hypertensive chronic kidney disease with stage 1 through stage 4 chronic kidney disease, or unspecified chronic kidney disease: Secondary | ICD-10-CM | POA: Diagnosis not present

## 2019-06-20 DIAGNOSIS — Z940 Kidney transplant status: Secondary | ICD-10-CM | POA: Diagnosis not present

## 2019-09-18 DIAGNOSIS — Z940 Kidney transplant status: Secondary | ICD-10-CM | POA: Diagnosis not present

## 2019-09-18 DIAGNOSIS — I129 Hypertensive chronic kidney disease with stage 1 through stage 4 chronic kidney disease, or unspecified chronic kidney disease: Secondary | ICD-10-CM | POA: Diagnosis not present

## 2019-10-22 DIAGNOSIS — Z940 Kidney transplant status: Secondary | ICD-10-CM | POA: Diagnosis not present

## 2019-10-22 DIAGNOSIS — E785 Hyperlipidemia, unspecified: Secondary | ICD-10-CM | POA: Diagnosis not present

## 2019-10-22 DIAGNOSIS — N183 Chronic kidney disease, stage 3 unspecified: Secondary | ICD-10-CM | POA: Diagnosis not present

## 2019-10-22 DIAGNOSIS — I129 Hypertensive chronic kidney disease with stage 1 through stage 4 chronic kidney disease, or unspecified chronic kidney disease: Secondary | ICD-10-CM | POA: Diagnosis not present

## 2019-12-17 DIAGNOSIS — I129 Hypertensive chronic kidney disease with stage 1 through stage 4 chronic kidney disease, or unspecified chronic kidney disease: Secondary | ICD-10-CM | POA: Diagnosis not present

## 2019-12-17 DIAGNOSIS — Z940 Kidney transplant status: Secondary | ICD-10-CM | POA: Diagnosis not present

## 2020-01-23 DIAGNOSIS — Z940 Kidney transplant status: Secondary | ICD-10-CM | POA: Diagnosis not present

## 2020-02-20 DIAGNOSIS — E7849 Other hyperlipidemia: Secondary | ICD-10-CM | POA: Diagnosis not present

## 2020-02-20 DIAGNOSIS — Z0000 Encounter for general adult medical examination without abnormal findings: Secondary | ICD-10-CM | POA: Diagnosis not present

## 2020-02-20 DIAGNOSIS — I10 Essential (primary) hypertension: Secondary | ICD-10-CM | POA: Diagnosis not present

## 2020-02-20 DIAGNOSIS — Z940 Kidney transplant status: Secondary | ICD-10-CM | POA: Diagnosis not present

## 2020-02-27 DIAGNOSIS — Z1212 Encounter for screening for malignant neoplasm of rectum: Secondary | ICD-10-CM | POA: Diagnosis not present

## 2020-02-27 DIAGNOSIS — D849 Immunodeficiency, unspecified: Secondary | ICD-10-CM | POA: Diagnosis not present

## 2020-02-27 DIAGNOSIS — Z940 Kidney transplant status: Secondary | ICD-10-CM | POA: Diagnosis not present

## 2020-02-27 DIAGNOSIS — R82998 Other abnormal findings in urine: Secondary | ICD-10-CM | POA: Diagnosis not present

## 2020-02-27 DIAGNOSIS — Z0000 Encounter for general adult medical examination without abnormal findings: Secondary | ICD-10-CM | POA: Diagnosis not present

## 2020-02-27 DIAGNOSIS — I10 Essential (primary) hypertension: Secondary | ICD-10-CM | POA: Diagnosis not present

## 2020-02-27 DIAGNOSIS — E785 Hyperlipidemia, unspecified: Secondary | ICD-10-CM | POA: Diagnosis not present

## 2020-02-27 DIAGNOSIS — Z1331 Encounter for screening for depression: Secondary | ICD-10-CM | POA: Diagnosis not present

## 2020-02-28 ENCOUNTER — Other Ambulatory Visit: Payer: Self-pay | Admitting: Internal Medicine

## 2020-02-28 DIAGNOSIS — R590 Localized enlarged lymph nodes: Secondary | ICD-10-CM

## 2020-03-13 ENCOUNTER — Ambulatory Visit
Admission: RE | Admit: 2020-03-13 | Discharge: 2020-03-13 | Disposition: A | Discharge status: Home / Self Care | Payer: BLUE CROSS/BLUE SHIELD | Source: Ambulatory Visit | Attending: Internal Medicine | Admitting: Internal Medicine

## 2020-03-13 DIAGNOSIS — R590 Localized enlarged lymph nodes: Secondary | ICD-10-CM

## 2020-03-18 DIAGNOSIS — N183 Chronic kidney disease, stage 3 unspecified: Secondary | ICD-10-CM | POA: Diagnosis not present

## 2020-03-18 DIAGNOSIS — Z940 Kidney transplant status: Secondary | ICD-10-CM | POA: Diagnosis not present

## 2020-05-13 DIAGNOSIS — N183 Chronic kidney disease, stage 3 unspecified: Secondary | ICD-10-CM | POA: Diagnosis not present

## 2020-05-13 DIAGNOSIS — E785 Hyperlipidemia, unspecified: Secondary | ICD-10-CM | POA: Diagnosis not present

## 2020-05-13 DIAGNOSIS — Z940 Kidney transplant status: Secondary | ICD-10-CM | POA: Diagnosis not present

## 2020-05-13 DIAGNOSIS — I129 Hypertensive chronic kidney disease with stage 1 through stage 4 chronic kidney disease, or unspecified chronic kidney disease: Secondary | ICD-10-CM | POA: Diagnosis not present

## 2020-09-03 ENCOUNTER — Other Ambulatory Visit: Payer: BC Managed Care – PPO

## 2020-09-03 DIAGNOSIS — Z20822 Contact with and (suspected) exposure to covid-19: Secondary | ICD-10-CM | POA: Diagnosis not present

## 2020-09-05 LAB — SARS-COV-2, NAA 2 DAY TAT

## 2020-09-05 LAB — NOVEL CORONAVIRUS, NAA: SARS-CoV-2, NAA: NOT DETECTED

## 2020-09-10 DIAGNOSIS — N183 Chronic kidney disease, stage 3 unspecified: Secondary | ICD-10-CM | POA: Diagnosis not present

## 2020-10-31 DIAGNOSIS — Z940 Kidney transplant status: Secondary | ICD-10-CM | POA: Diagnosis not present

## 2020-10-31 DIAGNOSIS — E785 Hyperlipidemia, unspecified: Secondary | ICD-10-CM | POA: Diagnosis not present

## 2020-10-31 DIAGNOSIS — N183 Chronic kidney disease, stage 3 unspecified: Secondary | ICD-10-CM | POA: Diagnosis not present

## 2020-10-31 DIAGNOSIS — I129 Hypertensive chronic kidney disease with stage 1 through stage 4 chronic kidney disease, or unspecified chronic kidney disease: Secondary | ICD-10-CM | POA: Diagnosis not present

## 2020-12-11 DIAGNOSIS — Z940 Kidney transplant status: Secondary | ICD-10-CM | POA: Diagnosis not present

## 2021-01-26 DIAGNOSIS — L858 Other specified epidermal thickening: Secondary | ICD-10-CM | POA: Diagnosis not present

## 2021-02-12 DIAGNOSIS — L821 Other seborrheic keratosis: Secondary | ICD-10-CM | POA: Diagnosis not present

## 2021-02-12 DIAGNOSIS — D485 Neoplasm of uncertain behavior of skin: Secondary | ICD-10-CM | POA: Diagnosis not present

## 2021-02-12 DIAGNOSIS — B078 Other viral warts: Secondary | ICD-10-CM | POA: Diagnosis not present

## 2021-03-20 DIAGNOSIS — Z940 Kidney transplant status: Secondary | ICD-10-CM | POA: Diagnosis not present

## 2021-03-23 DIAGNOSIS — Z125 Encounter for screening for malignant neoplasm of prostate: Secondary | ICD-10-CM | POA: Diagnosis not present

## 2021-03-23 DIAGNOSIS — E785 Hyperlipidemia, unspecified: Secondary | ICD-10-CM | POA: Diagnosis not present

## 2021-03-24 DIAGNOSIS — Z0000 Encounter for general adult medical examination without abnormal findings: Secondary | ICD-10-CM | POA: Diagnosis not present

## 2021-03-24 DIAGNOSIS — Z1389 Encounter for screening for other disorder: Secondary | ICD-10-CM | POA: Diagnosis not present

## 2021-03-24 DIAGNOSIS — I10 Essential (primary) hypertension: Secondary | ICD-10-CM | POA: Diagnosis not present

## 2021-03-24 DIAGNOSIS — Z1331 Encounter for screening for depression: Secondary | ICD-10-CM | POA: Diagnosis not present

## 2021-03-24 DIAGNOSIS — R82998 Other abnormal findings in urine: Secondary | ICD-10-CM | POA: Diagnosis not present

## 2021-06-04 DIAGNOSIS — Z940 Kidney transplant status: Secondary | ICD-10-CM | POA: Diagnosis not present

## 2021-06-12 DIAGNOSIS — E785 Hyperlipidemia, unspecified: Secondary | ICD-10-CM | POA: Diagnosis not present

## 2021-06-12 DIAGNOSIS — I129 Hypertensive chronic kidney disease with stage 1 through stage 4 chronic kidney disease, or unspecified chronic kidney disease: Secondary | ICD-10-CM | POA: Diagnosis not present

## 2021-06-12 DIAGNOSIS — N183 Chronic kidney disease, stage 3 unspecified: Secondary | ICD-10-CM | POA: Diagnosis not present

## 2021-06-12 DIAGNOSIS — Z940 Kidney transplant status: Secondary | ICD-10-CM | POA: Diagnosis not present

## 2021-08-17 IMAGING — US US SOFT TISSUE HEAD/NECK
1 series · 14 of 15 positions shown · non-contrast
Comparison: 05/19/2017.

CLINICAL DATA: Cervical lymphadenopathy.

EXAM:
ULTRASOUND OF HEAD/NECK SOFT TISSUES
TECHNIQUE: Ultrasound examination of the head and neck soft tissues was
performed in the area of clinical concern.

[Series 2: us soft tissue head/neck · 0.06mm/px · 14 of 15 slices shown]
[im 1/15]
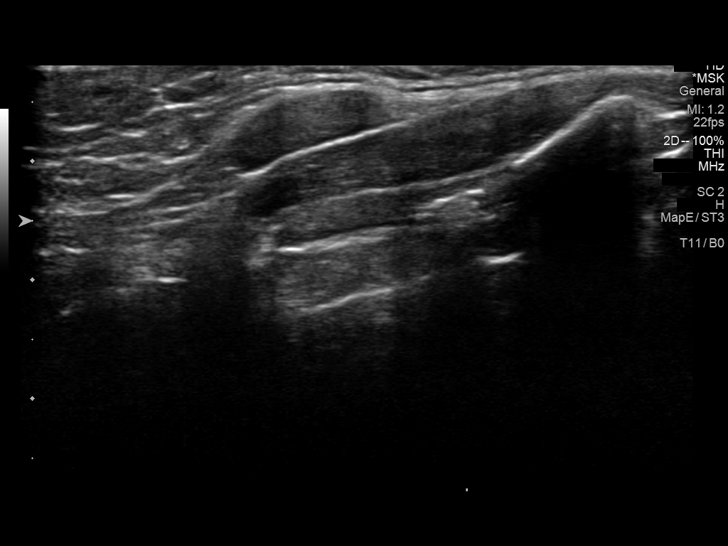
[im 2/15]
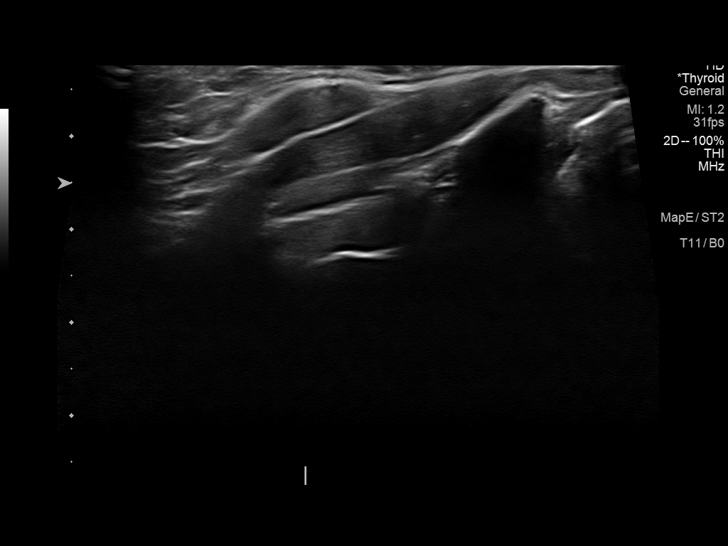
[im 3/15]
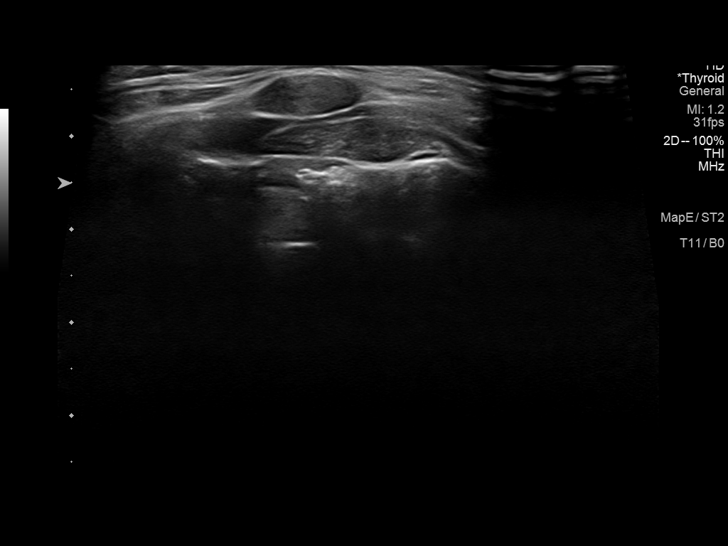
[im 4/15]
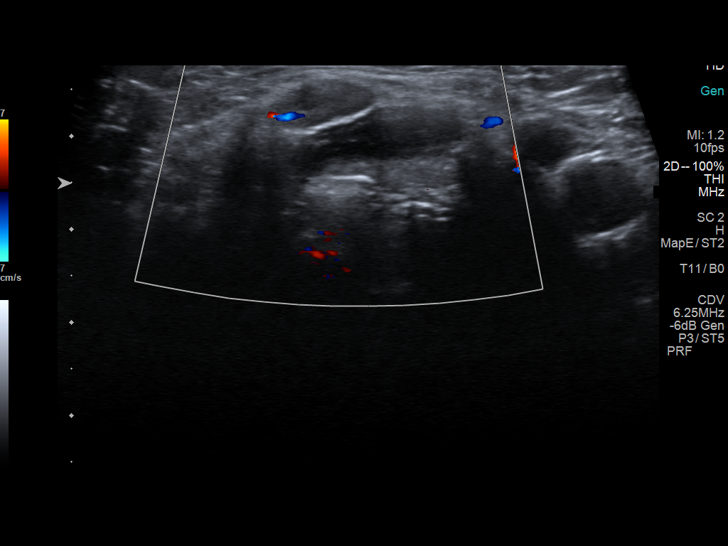
[im 5/15]
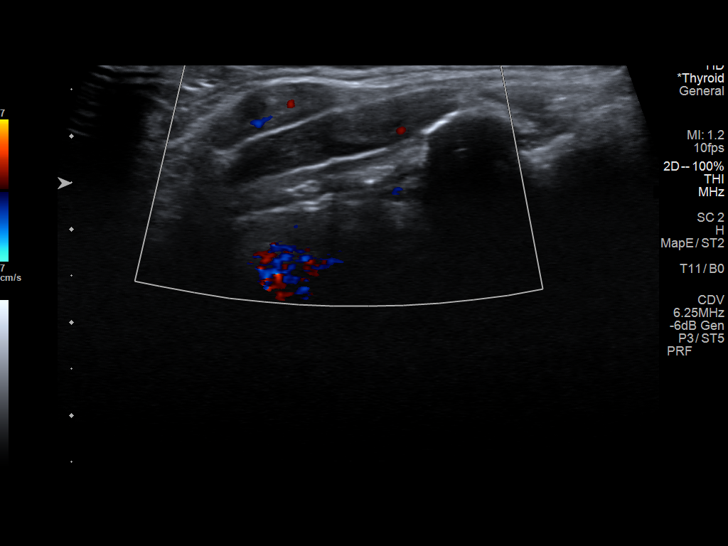
[im 6/15]
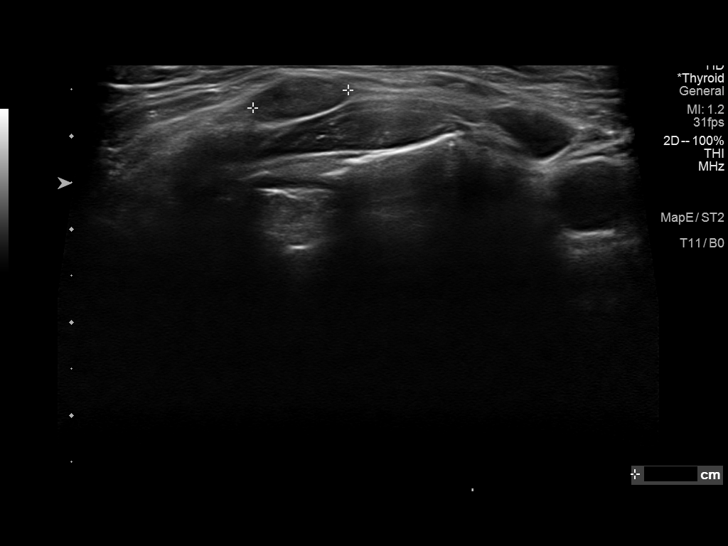
[im 7/15]
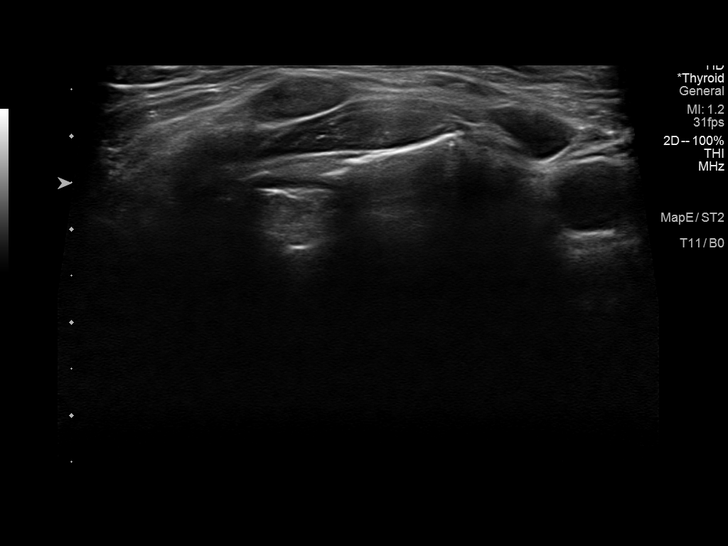
[im 9/15]
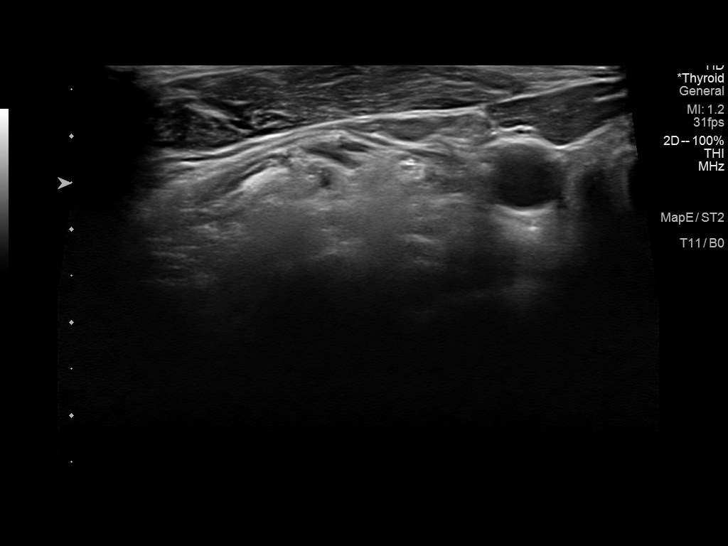
[im 10/15]
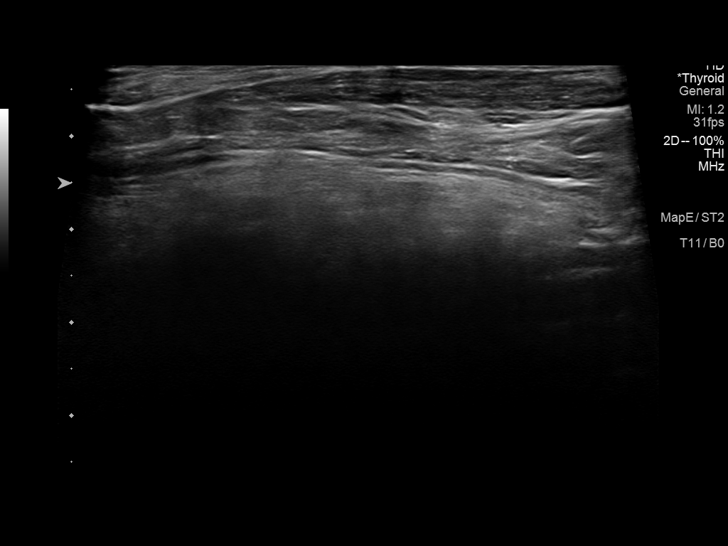
[im 11/15]
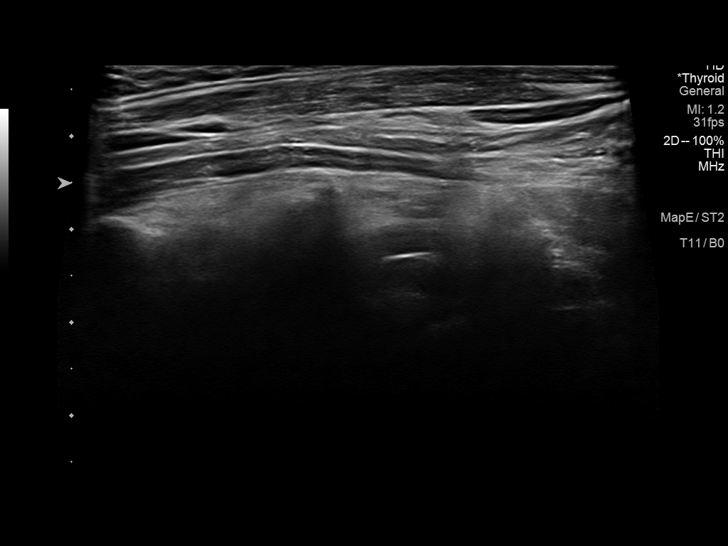
[im 12/15]
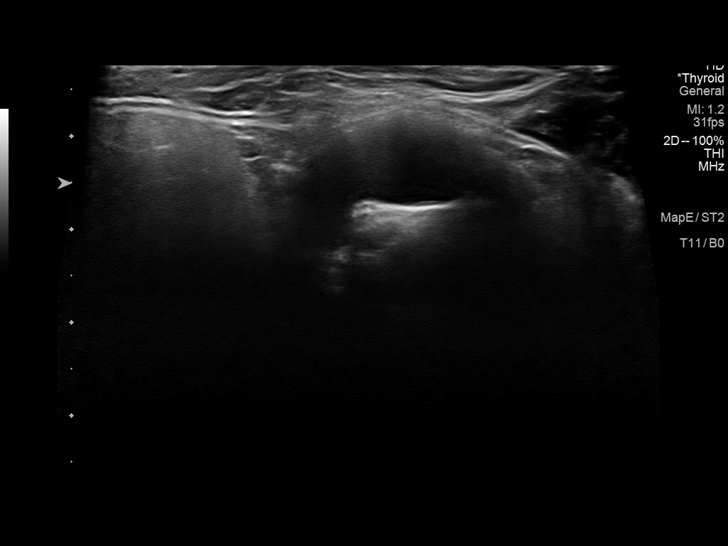
[im 13/15]
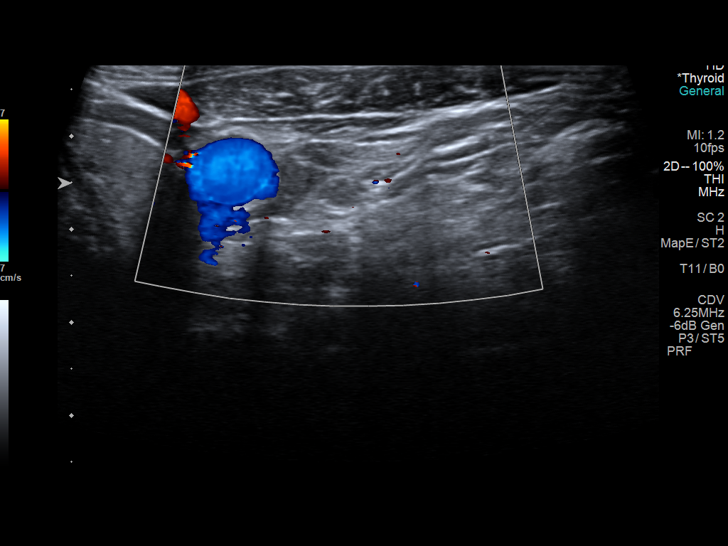
[im 14/15]
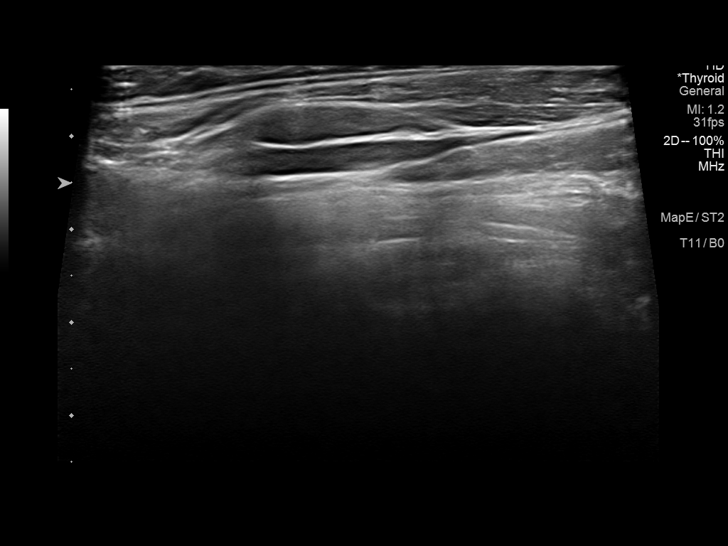
[im 15/15]
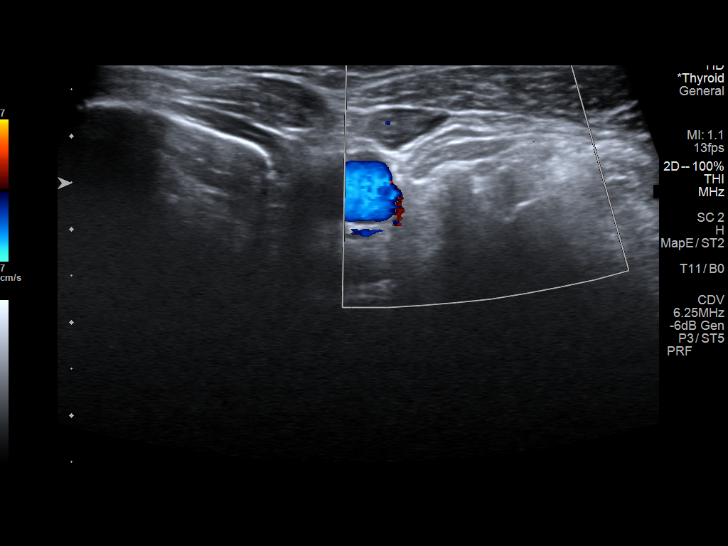

[14 of 15 positions shown; findings below may reference images not displayed]

FINDINGS: In the patient's palpable area of concern, there are small cervical
lymph nodes measuring up to approximately 0.4 cm in the short axis.
These lymph nodes demonstrate a normal ovoid shape and appear to
maintain a fatty hilum.
IMPRESSION: Small, morphologically normal lymph nodes are noted. In the absence
of interval growth, no further follow-up is recommended.

## 2021-09-10 DIAGNOSIS — Z940 Kidney transplant status: Secondary | ICD-10-CM | POA: Diagnosis not present

## 2021-12-15 DIAGNOSIS — Z940 Kidney transplant status: Secondary | ICD-10-CM | POA: Diagnosis not present

## 2021-12-21 DIAGNOSIS — E785 Hyperlipidemia, unspecified: Secondary | ICD-10-CM | POA: Diagnosis not present

## 2021-12-21 DIAGNOSIS — N183 Chronic kidney disease, stage 3 unspecified: Secondary | ICD-10-CM | POA: Diagnosis not present

## 2021-12-21 DIAGNOSIS — I129 Hypertensive chronic kidney disease with stage 1 through stage 4 chronic kidney disease, or unspecified chronic kidney disease: Secondary | ICD-10-CM | POA: Diagnosis not present

## 2021-12-21 DIAGNOSIS — Z940 Kidney transplant status: Secondary | ICD-10-CM | POA: Diagnosis not present

## 2022-03-24 DIAGNOSIS — M7502 Adhesive capsulitis of left shoulder: Secondary | ICD-10-CM | POA: Diagnosis not present

## 2022-03-29 DIAGNOSIS — M25512 Pain in left shoulder: Secondary | ICD-10-CM | POA: Diagnosis not present

## 2022-03-30 DIAGNOSIS — Z125 Encounter for screening for malignant neoplasm of prostate: Secondary | ICD-10-CM | POA: Diagnosis not present

## 2022-03-30 DIAGNOSIS — E785 Hyperlipidemia, unspecified: Secondary | ICD-10-CM | POA: Diagnosis not present

## 2022-04-05 DIAGNOSIS — M25512 Pain in left shoulder: Secondary | ICD-10-CM | POA: Diagnosis not present

## 2022-04-06 DIAGNOSIS — Z0000 Encounter for general adult medical examination without abnormal findings: Secondary | ICD-10-CM | POA: Diagnosis not present

## 2022-04-06 DIAGNOSIS — Z1331 Encounter for screening for depression: Secondary | ICD-10-CM | POA: Diagnosis not present

## 2022-04-06 DIAGNOSIS — Z1339 Encounter for screening examination for other mental health and behavioral disorders: Secondary | ICD-10-CM | POA: Diagnosis not present

## 2022-04-06 DIAGNOSIS — I10 Essential (primary) hypertension: Secondary | ICD-10-CM | POA: Diagnosis not present

## 2022-04-06 DIAGNOSIS — R82998 Other abnormal findings in urine: Secondary | ICD-10-CM | POA: Diagnosis not present

## 2022-04-12 DIAGNOSIS — M25512 Pain in left shoulder: Secondary | ICD-10-CM | POA: Diagnosis not present

## 2022-04-19 DIAGNOSIS — M25512 Pain in left shoulder: Secondary | ICD-10-CM | POA: Diagnosis not present

## 2022-04-27 DIAGNOSIS — M25512 Pain in left shoulder: Secondary | ICD-10-CM | POA: Diagnosis not present

## 2022-05-05 DIAGNOSIS — M25512 Pain in left shoulder: Secondary | ICD-10-CM | POA: Diagnosis not present

## 2022-05-12 DIAGNOSIS — M25512 Pain in left shoulder: Secondary | ICD-10-CM | POA: Diagnosis not present

## 2022-05-19 DIAGNOSIS — M25512 Pain in left shoulder: Secondary | ICD-10-CM | POA: Diagnosis not present

## 2022-05-26 DIAGNOSIS — M25512 Pain in left shoulder: Secondary | ICD-10-CM | POA: Diagnosis not present

## 2022-06-02 DIAGNOSIS — M25512 Pain in left shoulder: Secondary | ICD-10-CM | POA: Diagnosis not present

## 2022-06-11 ENCOUNTER — Encounter: Payer: Self-pay | Admitting: Internal Medicine

## 2022-06-15 DIAGNOSIS — M25512 Pain in left shoulder: Secondary | ICD-10-CM | POA: Diagnosis not present

## 2022-06-17 DIAGNOSIS — Z940 Kidney transplant status: Secondary | ICD-10-CM | POA: Diagnosis not present

## 2022-06-21 DIAGNOSIS — N183 Chronic kidney disease, stage 3 unspecified: Secondary | ICD-10-CM | POA: Diagnosis not present

## 2022-06-21 DIAGNOSIS — Z940 Kidney transplant status: Secondary | ICD-10-CM | POA: Diagnosis not present

## 2022-06-21 DIAGNOSIS — E785 Hyperlipidemia, unspecified: Secondary | ICD-10-CM | POA: Diagnosis not present

## 2022-06-21 DIAGNOSIS — I129 Hypertensive chronic kidney disease with stage 1 through stage 4 chronic kidney disease, or unspecified chronic kidney disease: Secondary | ICD-10-CM | POA: Diagnosis not present

## 2022-06-22 ENCOUNTER — Encounter: Payer: Self-pay | Admitting: Internal Medicine

## 2022-07-26 ENCOUNTER — Ambulatory Visit (AMBULATORY_SURGERY_CENTER): Payer: BC Managed Care – PPO

## 2022-07-26 VITALS — Ht 76.0 in | Wt 254.0 lb

## 2022-07-26 DIAGNOSIS — Z800 Family history of malignant neoplasm of digestive organs: Secondary | ICD-10-CM

## 2022-07-26 MED ORDER — NA SULFATE-K SULFATE-MG SULF 17.5-3.13-1.6 GM/177ML PO SOLN: 1.0000 | Freq: Once | ORAL | 0 refills | Status: AC

## 2022-07-26 NOTE — Progress Notes (Signed)

## 2022-08-18 ENCOUNTER — Encounter: Payer: Self-pay | Admitting: Internal Medicine

## 2022-08-20 ENCOUNTER — Ambulatory Visit (AMBULATORY_SURGERY_CENTER): Payer: BC Managed Care – PPO | Admitting: Internal Medicine

## 2022-08-20 ENCOUNTER — Encounter: Payer: Self-pay | Admitting: Internal Medicine

## 2022-08-20 VITALS — BP 118/79 | HR 61 | Temp 98.4°F | Resp 11 | Ht 76.0 in | Wt 254.0 lb

## 2022-08-20 DIAGNOSIS — Z1211 Encounter for screening for malignant neoplasm of colon: Secondary | ICD-10-CM | POA: Diagnosis not present

## 2022-08-20 DIAGNOSIS — D122 Benign neoplasm of ascending colon: Secondary | ICD-10-CM | POA: Diagnosis not present

## 2022-08-20 DIAGNOSIS — Z800 Family history of malignant neoplasm of digestive organs: Secondary | ICD-10-CM

## 2022-08-20 HISTORY — PX: COLONOSCOPY WITH PROPOFOL: SHX5780

## 2022-08-20 MED ORDER — SODIUM CHLORIDE 0.9 % IV SOLN: 500.0000 mL | Freq: Once | INTRAVENOUS | Status: DC

## 2022-08-20 NOTE — Progress Notes (Signed)
HISTORY OF PRESENT ILLNESS:  Michael Hayes is a 51 y.o. male with family history of colon cancer in his father less than age 36.  Now for screening.  Previous examinations in 2013 and 2018 were normal.  No complaints  REVIEW OF SYSTEMS:  All non-GI ROS negative except for  Past Medical History:  Diagnosis Date   Allergy    Cataract    GERD (gastroesophageal reflux disease)    Hyperlipidemia    Hypertension    Kidney failure 2002   kidney transplant 2002    Past Surgical History:  Procedure Laterality Date   bone spur  1997   heel, left foot   Cataracts Bilateral    COLONOSCOPY  2018   COLONOSCOPY WITH PROPOFOL  08/20/2022   KIDNEY TRANSPLANT  2002   TONSILLECTOMY  2011    Social History Michael Hayes  reports that he has never smoked. He has never used smokeless tobacco. He reports current alcohol use of about 1.0 standard drink of alcohol per week. He reports that he does not use drugs.  family history includes Colon cancer (age of onset: 41) in his father; Colon polyps in his father.  No Known Allergies     PHYSICAL EXAMINATION: Vital signs: BP 124/78   Pulse 88   Temp 98.4 F (36.9 C) (Temporal)   Ht '6\' 4"'$  (1.93 m)   Wt 254 lb (115.2 kg)   SpO2 97%   BMI 30.92 kg/m  General: Well-developed, well-nourished, no acute distress HEENT: Sclerae are anicteric, conjunctiva pink. Oral mucosa intact Lungs: Clear Heart: Regular Abdomen: soft, nontender, nondistended, no obvious ascites, no peritoneal signs, normal bowel sounds. No organomegaly. Extremities: No edema Psychiatric: alert and oriented x3. Cooperative     ASSESSMENT:  Family history of colon cancer   PLAN:   Screening colonoscopy

## 2022-08-20 NOTE — Progress Notes (Signed)
Called to room to assist during endoscopic procedure.  Patient ID and intended procedure confirmed with present staff. Received instructions for my participation in the procedure from the performing physician.  

## 2022-08-20 NOTE — Progress Notes (Signed)
Pt's states no medical or surgical changes since previsit or office visit. 

## 2022-08-20 NOTE — Patient Instructions (Signed)
Resume previous diet and medications. Awaiting pathology results. Repeat Colonoscopy date to be determined based on pathology. Hand outs provided on Colon polyps and Hemorrhoids  YOU HAD AN ENDOSCOPIC PROCEDURE TODAY AT [place]:   Refer to the procedure report that was given to you for any specific questions about what was found during the examination.  If the procedure report does not answer your questions, please call your gastroenterologist to clarify.  If you requested that your care partner not be given the details of your procedure findings, then the procedure report has been included in a sealed envelope for you to review at your convenience later.  YOU SHOULD EXPECT: Some feelings of bloating in the abdomen. Passage of more gas than usual.  Walking can help get rid of the air that was put into your GI tract during the procedure and reduce the bloating. If you had a lower endoscopy (such as a colonoscopy or flexible sigmoidoscopy) you may notice spotting of blood in your stool or on the toilet paper. If you underwent a bowel prep for your procedure, you may not have a normal bowel movement for a few days.  Please Note:  You might notice some irritation and congestion in your nose or some drainage.  This is from the oxygen used during your procedure.  There is no need for concern and it should clear up in a day or so.  SYMPTOMS TO REPORT IMMEDIATELY:  Following lower endoscopy (colonoscopy or flexible sigmoidoscopy):  Excessive amounts of blood in the stool  Significant tenderness or worsening of abdominal pains  Swelling of the abdomen that is new, acute  Fever of 100F or higher  For urgent or emergent issues, a gastroenterologist can be reached at any hour by calling [phone]. Do not use MyChart messaging for urgent concerns.    DIET:  We do recommend a small meal at first, but then you may proceed to your regular diet.  Drink plenty of fluids but you should  avoid alcoholic beverages for 24 hours.  ACTIVITY:  You should plan to take it easy for the rest of today and you should NOT DRIVE or use heavy machinery until tomorrow (because of the sedation medicines used during the test).    FOLLOW UP: Our staff will call the number listed on your records the next business day following your procedure.  We will call around 7:15- 8:00 am to check on you and address any questions or concerns that you may have regarding the information given to you following your procedure. If we do not reach you, we will leave a message.     If any biopsies were taken you will be contacted by phone or by letter within the next 1-3 weeks.  Please call us at [phone] if you have not heard about the biopsies in 3 weeks.    SIGNATURES/CONFIDENTIALITY: You and/or your care partner have signed paperwork which will be entered into your electronic medical record.  These signatures attest to the fact that that the information above on your After Visit Summary has been reviewed and is understood.  Full responsibility of the confidentiality of this discharge information lies with you and/or your care-partner.

## 2022-08-20 NOTE — Progress Notes (Signed)
Sedate, gd SR, tolerated procedure well, VSS, report to RN 

## 2022-08-20 NOTE — Op Note (Signed)
[place] Patient Name: Nate Common Procedure Date: 08/20/2022 10:49 AM MRN: [id number] Endoscopist: Docia Chuck. Henrene Pastor , MD, [id number] Age: 51 Referring MD:  Date of Birth: 01-19-1971 Gender: Male Account #: [id number] Procedure:                Colonoscopy with cold snare polypectomy x 1 Indications:              Screening in patient at increased risk: Colorectal                            cancer in father before age 88. Previous                            examinations 2013 and 2018 were negative for                            neoplasia Medicines:                Monitored Anesthesia Care Procedure:                Pre-Anesthesia Assessment:                           - Prior to the procedure, a History and Physical                            was performed, and patient medications and                            allergies were reviewed. The patient's tolerance of                            previous anesthesia was also reviewed. The risks                            and benefits of the procedure and the sedation                            options and risks were discussed with the patient.                            All questions were answered, and informed consent                            was obtained. Prior Anticoagulants: The patient has                            taken no anticoagulant or antiplatelet agents. ASA                            Grade Assessment: II - A patient with mild systemic                            disease. After reviewing the risks and benefits,  the patient was deemed in satisfactory condition to                            undergo the procedure.                           After obtaining informed consent, the colonoscope                            was passed under direct vision. Throughout the                            procedure, the patient's blood pressure, pulse, and                            oxygen saturations were  monitored continuously. The                            CF [id number] #[id number] was introduced through the anus                            and advanced to the the cecum, identified by                            appendiceal orifice and ileocecal valve. The                            ileocecal valve, appendiceal orifice, and rectum                            were photographed. The quality of the bowel                            preparation was excellent. The colonoscopy was                            performed without difficulty. The patient tolerated                            the procedure well. The bowel preparation used was                            SUPREP via split dose instruction. Scope In: 11:00:16 AM Scope Out: 11:12:28 AM Scope Withdrawal Time: 0 hours 10 minutes 39 seconds  Total Procedure Duration: 0 hours 12 minutes 12 seconds  Findings:                 A 4 mm polyp was found in the ascending colon. The                            polyp was removed with a cold snare. Resection and                            retrieval were complete.  Internal hemorrhoids were found during retroflexion.                           The exam was otherwise without abnormality on                            direct and retroflexion views. Complications:            No immediate complications. Estimated blood loss:                            None. Estimated Blood Loss:     Estimated blood loss: none. Impression:               - One 4 mm polyp in the ascending colon, removed                            with a cold snare. Resected and retrieved.                           - Internal hemorrhoids.                           - The examination was otherwise normal on direct                            and retroflexion views. Recommendation:           - Repeat colonoscopy in 5 years for surveillance.                           - Patient has a contact number available for                             emergencies. The signs and symptoms of potential                            delayed complications were discussed with the                            patient. Return to normal activities tomorrow.                            Written discharge instructions were provided to the                            patient.                           - Resume previous diet.                           - Continue present medications.                           - Await pathology results. Docia Chuck. Henrene Pastor, MD 08/20/2022 11:16:47 AM This report has been signed electronically.

## 2022-08-25 ENCOUNTER — Telehealth: Payer: Self-pay | Admitting: *Deleted

## 2022-08-25 NOTE — Telephone Encounter (Signed)
Attempted to call patient for their post-procedure follow-up call. No answer. Left voicemail.   

## 2022-08-26 ENCOUNTER — Encounter: Payer: Self-pay | Admitting: Internal Medicine

## 2022-11-12 DIAGNOSIS — Z940 Kidney transplant status: Secondary | ICD-10-CM | POA: Diagnosis not present

## 2022-11-23 DIAGNOSIS — E785 Hyperlipidemia, unspecified: Secondary | ICD-10-CM | POA: Diagnosis not present

## 2022-11-23 DIAGNOSIS — I129 Hypertensive chronic kidney disease with stage 1 through stage 4 chronic kidney disease, or unspecified chronic kidney disease: Secondary | ICD-10-CM | POA: Diagnosis not present

## 2022-11-23 DIAGNOSIS — N183 Chronic kidney disease, stage 3 unspecified: Secondary | ICD-10-CM | POA: Diagnosis not present

## 2022-11-23 DIAGNOSIS — Z940 Kidney transplant status: Secondary | ICD-10-CM | POA: Diagnosis not present

## 2022-12-08 DIAGNOSIS — D225 Melanocytic nevi of trunk: Secondary | ICD-10-CM | POA: Diagnosis not present

## 2022-12-08 DIAGNOSIS — D044 Carcinoma in situ of skin of scalp and neck: Secondary | ICD-10-CM | POA: Diagnosis not present

## 2022-12-08 DIAGNOSIS — L738 Other specified follicular disorders: Secondary | ICD-10-CM | POA: Diagnosis not present

## 2022-12-08 DIAGNOSIS — D485 Neoplasm of uncertain behavior of skin: Secondary | ICD-10-CM | POA: Diagnosis not present

## 2022-12-08 DIAGNOSIS — Z129 Encounter for screening for malignant neoplasm, site unspecified: Secondary | ICD-10-CM | POA: Diagnosis not present

## 2022-12-28 DIAGNOSIS — D044 Carcinoma in situ of skin of scalp and neck: Secondary | ICD-10-CM | POA: Diagnosis not present

## 2022-12-28 DIAGNOSIS — L570 Actinic keratosis: Secondary | ICD-10-CM | POA: Diagnosis not present

## 2023-01-27 DIAGNOSIS — Z940 Kidney transplant status: Secondary | ICD-10-CM | POA: Diagnosis not present

## 2023-04-19 DIAGNOSIS — E785 Hyperlipidemia, unspecified: Secondary | ICD-10-CM | POA: Diagnosis not present

## 2023-04-19 DIAGNOSIS — Z79899 Other long term (current) drug therapy: Secondary | ICD-10-CM | POA: Diagnosis not present

## 2023-04-26 DIAGNOSIS — I10 Essential (primary) hypertension: Secondary | ICD-10-CM | POA: Diagnosis not present

## 2023-04-26 DIAGNOSIS — Z1331 Encounter for screening for depression: Secondary | ICD-10-CM | POA: Diagnosis not present

## 2023-04-26 DIAGNOSIS — Z0000 Encounter for general adult medical examination without abnormal findings: Secondary | ICD-10-CM | POA: Diagnosis not present

## 2023-04-26 DIAGNOSIS — Z23 Encounter for immunization: Secondary | ICD-10-CM | POA: Diagnosis not present

## 2023-04-26 DIAGNOSIS — Z125 Encounter for screening for malignant neoplasm of prostate: Secondary | ICD-10-CM | POA: Diagnosis not present

## 2023-04-26 DIAGNOSIS — Z1339 Encounter for screening examination for other mental health and behavioral disorders: Secondary | ICD-10-CM | POA: Diagnosis not present

## 2023-05-16 DIAGNOSIS — Z940 Kidney transplant status: Secondary | ICD-10-CM | POA: Diagnosis not present

## 2023-05-24 DIAGNOSIS — N183 Chronic kidney disease, stage 3 unspecified: Secondary | ICD-10-CM | POA: Diagnosis not present

## 2023-05-24 DIAGNOSIS — Z940 Kidney transplant status: Secondary | ICD-10-CM | POA: Diagnosis not present

## 2023-05-24 DIAGNOSIS — E785 Hyperlipidemia, unspecified: Secondary | ICD-10-CM | POA: Diagnosis not present

## 2023-05-24 DIAGNOSIS — I129 Hypertensive chronic kidney disease with stage 1 through stage 4 chronic kidney disease, or unspecified chronic kidney disease: Secondary | ICD-10-CM | POA: Diagnosis not present

## 2023-07-05 DIAGNOSIS — D2261 Melanocytic nevi of right upper limb, including shoulder: Secondary | ICD-10-CM | POA: Diagnosis not present

## 2023-07-05 DIAGNOSIS — D2262 Melanocytic nevi of left upper limb, including shoulder: Secondary | ICD-10-CM | POA: Diagnosis not present

## 2023-07-05 DIAGNOSIS — Z86008 Personal history of in-situ neoplasm of other site: Secondary | ICD-10-CM | POA: Diagnosis not present

## 2023-07-05 DIAGNOSIS — D225 Melanocytic nevi of trunk: Secondary | ICD-10-CM | POA: Diagnosis not present

## 2023-07-06 DIAGNOSIS — Z940 Kidney transplant status: Secondary | ICD-10-CM | POA: Diagnosis not present

## 2023-08-03 DIAGNOSIS — N1831 Chronic kidney disease, stage 3a: Secondary | ICD-10-CM | POA: Diagnosis not present

## 2023-08-03 DIAGNOSIS — Z940 Kidney transplant status: Secondary | ICD-10-CM | POA: Diagnosis not present

## 2023-11-01 DIAGNOSIS — R7989 Other specified abnormal findings of blood chemistry: Secondary | ICD-10-CM | POA: Diagnosis not present

## 2023-11-01 DIAGNOSIS — Z940 Kidney transplant status: Secondary | ICD-10-CM | POA: Diagnosis not present

## 2023-11-01 DIAGNOSIS — N1831 Chronic kidney disease, stage 3a: Secondary | ICD-10-CM | POA: Diagnosis not present

## 2023-11-21 DIAGNOSIS — I129 Hypertensive chronic kidney disease with stage 1 through stage 4 chronic kidney disease, or unspecified chronic kidney disease: Secondary | ICD-10-CM | POA: Diagnosis not present

## 2023-11-21 DIAGNOSIS — E785 Hyperlipidemia, unspecified: Secondary | ICD-10-CM | POA: Diagnosis not present

## 2023-11-21 DIAGNOSIS — N1831 Chronic kidney disease, stage 3a: Secondary | ICD-10-CM | POA: Diagnosis not present

## 2023-11-21 DIAGNOSIS — Z940 Kidney transplant status: Secondary | ICD-10-CM | POA: Diagnosis not present

## 2024-05-10 DIAGNOSIS — Z125 Encounter for screening for malignant neoplasm of prostate: Secondary | ICD-10-CM | POA: Diagnosis not present

## 2024-05-10 DIAGNOSIS — Z1389 Encounter for screening for other disorder: Secondary | ICD-10-CM | POA: Diagnosis not present

## 2024-05-16 DIAGNOSIS — N1831 Chronic kidney disease, stage 3a: Secondary | ICD-10-CM | POA: Diagnosis not present

## 2024-05-16 DIAGNOSIS — Z940 Kidney transplant status: Secondary | ICD-10-CM | POA: Diagnosis not present

## 2024-05-17 DIAGNOSIS — I10 Essential (primary) hypertension: Secondary | ICD-10-CM | POA: Diagnosis not present

## 2024-05-24 DIAGNOSIS — I10 Essential (primary) hypertension: Secondary | ICD-10-CM | POA: Diagnosis not present

## 2024-05-24 DIAGNOSIS — Z1331 Encounter for screening for depression: Secondary | ICD-10-CM | POA: Diagnosis not present

## 2024-05-24 DIAGNOSIS — Z1339 Encounter for screening examination for other mental health and behavioral disorders: Secondary | ICD-10-CM | POA: Diagnosis not present

## 2024-05-24 DIAGNOSIS — Z0000 Encounter for general adult medical examination without abnormal findings: Secondary | ICD-10-CM | POA: Diagnosis not present

## 2024-05-24 DIAGNOSIS — Z23 Encounter for immunization: Secondary | ICD-10-CM | POA: Diagnosis not present

## 2024-05-24 DIAGNOSIS — R82998 Other abnormal findings in urine: Secondary | ICD-10-CM | POA: Diagnosis not present

## 2024-05-29 DIAGNOSIS — E785 Hyperlipidemia, unspecified: Secondary | ICD-10-CM | POA: Diagnosis not present

## 2024-05-29 DIAGNOSIS — Z940 Kidney transplant status: Secondary | ICD-10-CM | POA: Diagnosis not present

## 2024-05-29 DIAGNOSIS — I129 Hypertensive chronic kidney disease with stage 1 through stage 4 chronic kidney disease, or unspecified chronic kidney disease: Secondary | ICD-10-CM | POA: Diagnosis not present

## 2024-05-29 DIAGNOSIS — N1831 Chronic kidney disease, stage 3a: Secondary | ICD-10-CM | POA: Diagnosis not present

## 2024-06-06 DIAGNOSIS — N1831 Chronic kidney disease, stage 3a: Secondary | ICD-10-CM | POA: Diagnosis not present

## 2024-06-06 DIAGNOSIS — Z940 Kidney transplant status: Secondary | ICD-10-CM | POA: Diagnosis not present

## 2024-07-04 DIAGNOSIS — D225 Melanocytic nevi of trunk: Secondary | ICD-10-CM | POA: Diagnosis not present

## 2024-07-04 DIAGNOSIS — L814 Other melanin hyperpigmentation: Secondary | ICD-10-CM | POA: Diagnosis not present

## 2024-07-04 DIAGNOSIS — L570 Actinic keratosis: Secondary | ICD-10-CM | POA: Diagnosis not present

## 2024-07-04 DIAGNOSIS — L821 Other seborrheic keratosis: Secondary | ICD-10-CM | POA: Diagnosis not present

## 2024-07-04 DIAGNOSIS — L738 Other specified follicular disorders: Secondary | ICD-10-CM | POA: Diagnosis not present
